# Patient Record
Sex: Male | Born: 1980 | Race: Black or African American | Hispanic: No | Marital: Single | Smoking: Current every day smoker
Health system: Southern US, Community
[De-identification: ages and names within clinical notes are randomized; demographics above are authoritative.]

## PROBLEM LIST (undated history)

## (undated) DIAGNOSIS — I10 Essential (primary) hypertension: Secondary | ICD-10-CM

## (undated) DIAGNOSIS — F1910 Other psychoactive substance abuse, uncomplicated: Secondary | ICD-10-CM

## (undated) HISTORY — DX: Other psychoactive substance abuse, uncomplicated: F19.10

## (undated) HISTORY — DX: Essential (primary) hypertension: I10

---

## 2002-06-12 ENCOUNTER — Emergency Department (HOSPITAL_COMMUNITY): Admission: EM | Admit: 2002-06-12 | Discharge: 2002-06-12 | Payer: Self-pay | Admitting: Emergency Medicine

## 2004-03-22 ENCOUNTER — Emergency Department (HOSPITAL_COMMUNITY): Admission: EM | Admit: 2004-03-22 | Discharge: 2004-03-22 | Payer: Self-pay | Admitting: Emergency Medicine

## 2004-11-10 ENCOUNTER — Emergency Department (HOSPITAL_COMMUNITY): Admission: EM | Admit: 2004-11-10 | Discharge: 2004-11-10 | Payer: Self-pay | Admitting: Emergency Medicine

## 2004-11-15 ENCOUNTER — Ambulatory Visit (HOSPITAL_COMMUNITY): Admission: RE | Admit: 2004-11-15 | Discharge: 2004-11-15 | Payer: Self-pay | Admitting: Orthopedic Surgery

## 2005-01-24 ENCOUNTER — Emergency Department (HOSPITAL_COMMUNITY): Admission: EM | Admit: 2005-01-24 | Discharge: 2005-01-24 | Payer: Self-pay | Admitting: Family Medicine

## 2007-10-08 ENCOUNTER — Emergency Department (HOSPITAL_COMMUNITY): Admission: EM | Admit: 2007-10-08 | Discharge: 2007-10-08 | Payer: Self-pay | Admitting: Emergency Medicine

## 2012-01-20 ENCOUNTER — Emergency Department (HOSPITAL_COMMUNITY)
Admission: EM | Admit: 2012-01-20 | Discharge: 2012-01-20 | Disposition: A | Discharge status: Home / Self Care | Payer: Self-pay | Attending: Emergency Medicine | Admitting: Emergency Medicine

## 2012-01-20 ENCOUNTER — Encounter (HOSPITAL_COMMUNITY): Payer: Self-pay

## 2012-01-20 ENCOUNTER — Emergency Department (INDEPENDENT_AMBULATORY_CARE_PROVIDER_SITE_OTHER)
Admission: EM | Admit: 2012-01-20 | Discharge: 2012-01-20 | Disposition: A | Discharge status: Home / Self Care | Payer: Self-pay | Source: Home / Self Care | Attending: Emergency Medicine | Admitting: Emergency Medicine

## 2012-01-20 ENCOUNTER — Encounter (HOSPITAL_COMMUNITY): Payer: Self-pay | Admitting: *Deleted

## 2012-01-20 ENCOUNTER — Emergency Department (HOSPITAL_COMMUNITY): Payer: Self-pay

## 2012-01-20 DIAGNOSIS — M25539 Pain in unspecified wrist: Secondary | ICD-10-CM | POA: Insufficient documentation

## 2012-01-20 DIAGNOSIS — S62113A Displaced fracture of triquetrum [cuneiform] bone, unspecified wrist, initial encounter for closed fracture: Secondary | ICD-10-CM | POA: Insufficient documentation

## 2012-01-20 DIAGNOSIS — Y9351 Activity, roller skating (inline) and skateboarding: Secondary | ICD-10-CM | POA: Insufficient documentation

## 2012-01-20 DIAGNOSIS — S62111A Displaced fracture of triquetrum [cuneiform] bone, right wrist, initial encounter for closed fracture: Secondary | ICD-10-CM

## 2012-01-20 DIAGNOSIS — R000 Tachycardia, unspecified: Secondary | ICD-10-CM | POA: Insufficient documentation

## 2012-01-20 MED ORDER — TRAMADOL HCL 50 MG PO TABS: 50.0000 mg | ORAL_TABLET | Freq: Four times a day (QID) | ORAL | Status: AC | PRN

## 2012-01-20 MED ORDER — IBUPROFEN 400 MG PO TABS: 800.0000 mg | ORAL_TABLET | Freq: Four times a day (QID) | ORAL | Status: AC | PRN

## 2012-01-20 MED ORDER — HYDROCODONE-ACETAMINOPHEN 5-325 MG PO TABS
ORAL_TABLET | ORAL | Status: AC
  Filled 2012-01-20: qty 2

## 2012-01-20 MED ORDER — IBUPROFEN 800 MG PO TABS: 800.0000 mg | ORAL_TABLET | Freq: Three times a day (TID) | ORAL | Status: AC

## 2012-01-20 MED ORDER — IBUPROFEN 400 MG PO TABS: 800.0000 mg | ORAL_TABLET | Freq: Four times a day (QID) | ORAL | Status: DC | PRN

## 2012-01-20 NOTE — ED Notes (Signed)
Ortho paged for R wrist splint

## 2012-01-20 NOTE — ED Provider Notes (Signed)
History     CSN: [id number]  Arrival date & time 01/20/12  0159   First MD Initiated Contact with Patient 01/20/12 0207      Chief Complaint  Patient presents with  . Wrist Pain  . Fall     HPI  Hx provided by pt.  Pt is a 31 yo M with no sig. PMH who presents with complaints of rt wrist and hand injury.  Pt reports falling back on outstretched rt hand while roller skating around 8pm earlier last evening.  Pain is an ache and throb.  Pain is moderate. Pt returned home and states he drank beer to help with his pain and relax.  He also used ice over area with slight improvement.  Pain is worse with movements. Pain has continued to bother him.  Pain is moderate to severe and throbs.  He denies any numbness or tingling of fingers. he denies any other injury. No trauma to head. No LOC.   History reviewed. No pertinent past medical history.  History reviewed. No pertinent past surgical history.  History reviewed. No pertinent family history.  History  Substance Use Topics  . Smoking status: Current Some Day Smoker  . Smokeless tobacco: Not on file  . Alcohol Use: Yes      Review of Systems  All other systems reviewed and are negative.    Allergies  Review of patient's allergies indicates no known allergies.  Home Medications  No current outpatient prescriptions on file.  BP 150/103  Pulse 130  Temp(Src) 98.4 F (36.9 C) (Oral)  Resp 16  SpO2 97%  Physical Exam  Nursing note and vitals reviewed. Constitutional: He is oriented to person, place, and time. He appears well-developed and well-nourished. No distress.  HENT:  Head: Normocephalic and atraumatic.       Breath smells of alcohol  Neck: Normal range of motion. Neck supple.       No cervical midline TTP.  Cardiovascular: Regular rhythm.  Tachycardia present.   Pulmonary/Chest: Effort normal and breath sounds normal.  Musculoskeletal: Normal range of motion. He exhibits no edema.       TTP over rt 3rd  metacarpal.  Mild Pains in rt wrist.  No deformities.  No swelling.  Normal distal sensations and cap refill in fingers.  Neurological: He is alert and oriented to person, place, and time.  Skin: Skin is warm. No erythema.  Psychiatric: He has a normal mood and affect. His behavior is normal.    ED Course  Procedures     Dg Forearm Right  01/20/2012  *RADIOLOGY REPORT*  Clinical Data: Right wrist and forearm pain status post fall.  RIGHT FOREARM - 2 VIEW  Comparison: Contemporaneous hand radiograph  Findings: No fracture or dislocation of the forearm identified. There is a curvilinear calcific density posterior to the wrist, described on contemporaneous hand radiograph.  No aggressive osseous lesion.  IMPRESSION: No acute osseous abnormality of the forearm.  Triquetral fracture, described on contemporaneous hand radiograph report.  Original Report Authenticated By: Waneta Martins, M.D.   Dg Hand Complete Right  01/20/2012  *RADIOLOGY REPORT*  Clinical Data: Right hand and forearm pain status post fall.  RIGHT HAND - COMPLETE 3+ VIEW  Comparison: Contemporaneous forearm  Findings: There is soft tissue swelling overlying the ulnar aspect of the hand.  There is a curvilinear density projecting posterior to the wrist which is favored to represent a fracture of the triquetrum. Detection for additional nondisplaced fractures is limited by  unconventional positioning.  Per the technologist, the patient was unable to flatten his hand and was not fully cooperative.  IMPRESSION: Curvilinear calcific density along the dorsal aspect of the wrist is favored to represent a triquetral fracture.  Original Report Authenticated By: Waneta Martins, M.D.     1. Fracture of triquetrum of right wrist, closed       MDM  2:10AM Pt seen and evaluated.  Pt in no acute distress.         Angus Seller, [place] 01/20/12 [phone]

## 2012-01-20 NOTE — ED Provider Notes (Addendum)
History     CSN: [id number]  Arrival date & time 01/20/12  1030   First MD Initiated Contact with Patient 01/20/12 1215      Chief Complaint  Patient presents with  . Wrist Pain    (Consider location/radiation/quality/duration/timing/severity/associated sxs/prior treatment) HPI Comments: It hurting, was not given anything for pain, tried to see the hand doctor but they asked  for 200.oo dollars  Patient is a 31 y.o. male presenting with wrist pain. The history is provided by the patient.  Wrist Pain This is a new problem. The problem has not changed since onset.Exacerbated by: movment. The symptoms are relieved by nothing.    History reviewed. No pertinent past medical history.  History reviewed. No pertinent past surgical history.  History reviewed. No pertinent family history.  History  Substance Use Topics  . Smoking status: Current Some Day Smoker  . Smokeless tobacco: Not on file  . Alcohol Use: Yes      Review of Systems  Constitutional: Negative for fever and fatigue.  Skin: Negative for rash and wound.  Neurological: Negative for weakness and numbness.    Allergies  Review of patient's allergies indicates no known allergies.  Home Medications   Current Outpatient Rx  Name Route Sig Dispense Refill  . IBUPROFEN 400 MG PO TABS Oral Take 2 tablets (800 mg total) by mouth every 6 (six) hours as needed for pain. 30 tablet 0  . IBUPROFEN 800 MG PO TABS Oral Take 1 tablet (800 mg total) by mouth 3 (three) times daily. 21 tablet 0  . TRAMADOL HCL 50 MG PO TABS Oral Take 1 tablet (50 mg total) by mouth every 6 (six) hours as needed for pain. 15 tablet 0    BP 164/71  Pulse 97  Temp(Src) 98.3 F (36.8 C) (Oral)  Resp 18  SpO2 97%  Physical Exam  Nursing note and vitals reviewed. Constitutional: He appears well-developed and well-nourished.  Musculoskeletal: He exhibits tenderness.       Right shoulder: He exhibits decreased range of motion, tenderness  and pain.       Right wrist: He exhibits decreased range of motion, tenderness and bony tenderness. He exhibits no effusion, no crepitus and no laceration.  Neurological: He is alert.  Skin: Skin is warm.    ED Course  Procedures (including critical care time)  Labs Reviewed - No data to display Dg Forearm Right  01/20/2012  *RADIOLOGY REPORT*  Clinical Data: Right wrist and forearm pain status post fall.  RIGHT FOREARM - 2 VIEW  Comparison: Contemporaneous hand radiograph  Findings: No fracture or dislocation of the forearm identified. There is a curvilinear calcific density posterior to the wrist, described on contemporaneous hand radiograph.  No aggressive osseous lesion.  IMPRESSION: No acute osseous abnormality of the forearm.  Triquetral fracture, described on contemporaneous hand radiograph report.  Original Report Authenticated By: Waneta Martins, M.D.   Dg Hand Complete Right  01/20/2012  *RADIOLOGY REPORT*  Clinical Data: Right hand and forearm pain status post fall.  RIGHT HAND - COMPLETE 3+ VIEW  Comparison: Contemporaneous forearm  Findings: There is soft tissue swelling overlying the ulnar aspect of the hand.  There is a curvilinear density projecting posterior to the wrist which is favored to represent a fracture of the triquetrum. Detection for additional nondisplaced fractures is limited by unconventional positioning.  Per the technologist, the patient was unable to flatten his hand and was not fully cooperative.  IMPRESSION: Curvilinear calcific density along the  dorsal aspect of the wrist is favored to represent a triquetral fracture.  Original Report Authenticated By: Waneta Martins, M.D.     1. Triquetral fracture       MDM  No neurovascular deficits, here today for pain control.        Jimmie Molly, MD 01/20/12 1610  Jimmie Molly, MD 04/08/12 [phone]

## 2012-01-20 NOTE — ED Notes (Signed)
Pt called x1

## 2012-01-20 NOTE — ED Notes (Signed)
Fell while skating with his kids, put R arm out to brace fall, c/o R wrist pain, pain into hand at up to elbow, CMS intact, ROM limited d/t pain, guarding movements, no obvious deformity, no meds PTA, Td unknown, skin intact. Pinpoints pain to distal and mid ulna.

## 2012-01-20 NOTE — ED Notes (Signed)
Seen in ED this am for rt wrist injury/fracture.  States he needs something for the pain, states called orthopedist but he doesn't have the money for the visit.

## 2012-01-21 NOTE — ED Provider Notes (Signed)
31 year old male who presents after a fall while rollerskating onto his right hand and wrist. This occurred several hours ago, the pain is constant, moderate, worse with movement and not associated with numbness or tingling of the fingers. He denies any other injuries   Physical exam: Normocephalic atraumatic, conjunctiva clear no scleral icterus, speech is clear, gait is normal, motor is intact, sensation intact to the extremities, tenderness over the dorsum of the right wrist and in the anatomic snuff box. There is no tenderness over the distal radius and ulna. Normal capillary refill   Assessment: Imaging pending, according to my interpretation of the x-rays there appears to be a small fracture off the dorsum of the wrist, no obvious fracture of the scaphoid, and immobilize, anti-inflammatory therapy and f/u  Medical screening examination/treatment/procedure(s) were conducted as a shared visit with non-physician practitioner(s) and myself. I personally evaluated the patient during the encounter   Vida Roller, MD 01/21/12 [phone]

## 2012-01-27 ENCOUNTER — Ambulatory Visit (INDEPENDENT_AMBULATORY_CARE_PROVIDER_SITE_OTHER): Payer: Self-pay | Admitting: Family Medicine

## 2012-01-27 ENCOUNTER — Encounter: Payer: Self-pay | Admitting: Family Medicine

## 2012-01-27 VITALS — BP 163/100 | HR 65 | Ht 68.0 in | Wt 170.0 lb

## 2012-01-27 DIAGNOSIS — S62111A Displaced fracture of triquetrum [cuneiform] bone, right wrist, initial encounter for closed fracture: Secondary | ICD-10-CM

## 2012-01-27 DIAGNOSIS — S62113A Displaced fracture of triquetrum [cuneiform] bone, unspecified wrist, initial encounter for closed fracture: Secondary | ICD-10-CM

## 2012-01-27 MED ORDER — IBUPROFEN 800 MG PO TABS: 800.0000 mg | ORAL_TABLET | Freq: Three times a day (TID) | ORAL | Status: AC | PRN

## 2012-01-27 MED ORDER — TRAMADOL HCL 50 MG PO TABS: 50.0000 mg | ORAL_TABLET | Freq: Four times a day (QID) | ORAL | Status: AC | PRN

## 2012-01-27 NOTE — Progress Notes (Signed)
  Subjective:    Patient ID: Daniel Shepard, male    DOB: [birth year], 31 y.o.   MRN: [id number]  HPI Mr. Sime fell from his feel on 01/19/12 and injured his right wrist when he landed, he was seen at [facility] , x rays were done, he was diagnosed with a triquetrum fracture, he was immobilized with a wrist splint and given medication for pain control. He states that his wrist is hurting, the pain is diffuse in the ulnar side of the wrist and radiated to his fifth finger, sharp, 4/10 intensity. He has been using his splint with high compliance.  There is no problem list on file for this patient.  Current Outpatient Prescriptions on File Prior to Visit  Medication Sig Dispense Refill  . ibuprofen (ADVIL,MOTRIN) 400 MG tablet Take 2 tablets (800 mg total) by mouth every 6 (six) hours as needed for pain.  30 tablet  0  . traMADol (ULTRAM) 50 MG tablet Take 1 tablet (50 mg total) by mouth every 6 (six) hours as needed for pain.  15 tablet  0   No Known Allergies    Review of Systems  Constitutional: Negative for chills, diaphoresis and fatigue.  Musculoskeletal: Negative for myalgias, back pain, joint swelling, arthralgias and gait problem.  Neurological: Negative for dizziness, tremors, weakness and numbness.       Objective:   Physical Exam  Constitutional: He is oriented to person, place, and time. He appears well-developed and well-nourished.       BP 163/100  Pulse 65  Ht 5\' 8"  (1.727 m)  Wt 170 lb (77.111 kg)  BMI 25.85 kg/m2   Pulmonary/Chest: Effort normal.  Musculoskeletal:       Right wrist with intact skin, no swelling, no hematoma. TTP in the ulnar aspect of his wrist, mild TTP above the fifth metacarpal, no swelling or crepitus. Neurovascularly intact.  Neurological: He is alert and oriented to person, place, and time.  Skin: Skin is warm.  Psychiatric: He has a normal mood and affect. His behavior is normal.    X arys from ER review showing triquetrum fracture      Assessment & Plan:   1. Fracture of triquetrum of right wrist, closed  ibuprofen (ADVIL,MOTRIN) 800 MG tablet, traMADol (ULTRAM) 50 MG tablet   Discussed with him the merit of a cast, he declined Recommended to continue using the wrist splint Rx given for motrin 800mg  and tramadol F/U in 1 week. We will repeat x rays. Recommended to establish care with PCP for HTN

## 2012-01-27 NOTE — Patient Instructions (Signed)
[facility] [phone]  [facility] -[phone]  These offices are accepting new patients

## 2012-01-30 ENCOUNTER — Ambulatory Visit (INDEPENDENT_AMBULATORY_CARE_PROVIDER_SITE_OTHER): Payer: Self-pay | Admitting: Sports Medicine

## 2012-01-30 ENCOUNTER — Encounter: Payer: Self-pay | Admitting: Sports Medicine

## 2012-01-30 VITALS — BP 146/102 | HR 65 | Temp 98.3°F | Ht 68.0 in | Wt 167.7 lb

## 2012-01-30 DIAGNOSIS — Z720 Tobacco use: Secondary | ICD-10-CM

## 2012-01-30 DIAGNOSIS — Z23 Encounter for immunization: Secondary | ICD-10-CM

## 2012-01-30 DIAGNOSIS — F1010 Alcohol abuse, uncomplicated: Secondary | ICD-10-CM

## 2012-01-30 DIAGNOSIS — F17200 Nicotine dependence, unspecified, uncomplicated: Secondary | ICD-10-CM

## 2012-01-30 DIAGNOSIS — I10 Essential (primary) hypertension: Secondary | ICD-10-CM | POA: Insufficient documentation

## 2012-01-30 LAB — BASIC METABOLIC PANEL
BUN: 13 mg/dL (ref 6–23)
CO2: 25 mEq/L (ref 19–32)
Calcium: 10.2 mg/dL (ref 8.4–10.5)
Chloride: 99 mEq/L (ref 96–112)
Creat: 0.95 mg/dL (ref 0.50–1.35)
Glucose, Bld: 82 mg/dL (ref 70–99)
Potassium: 5.1 mEq/L (ref 3.5–5.3)
Sodium: 139 mEq/L (ref 135–145)

## 2012-01-30 MED ORDER — HYDROCHLOROTHIAZIDE 25 MG PO TABS: 25.0000 mg | ORAL_TABLET | Freq: Every day | ORAL | Status: DC

## 2012-01-30 NOTE — Progress Notes (Signed)
Patient ID: Daniel Shepard, male   DOB: 14-Jan-1981, 31 y.o.   MRN: [id number] Subjective:  Daniel Shepard is a 31 y.o. male presenting today for establishing care and evaluation of his elevated BPs.  He reports that he is otherwise healthy and no prior hospitalizations.  Does report elevated BPs in past but has never had then treated.  He was incarcerated for a DUI in 2007 and he was noted at that time to have elevated BP but had not been followed since that time.  No prior medication use.   ROS  Constitutional No changes in behaviors  Infectious No recent illnesses  Resp No reported cough or congestion  Cardiac No reported chest pain, or prior cardiac illnesses  GI Occasional abdominal pain in area of umbilical hernia that is self reducible  Psych Hx of EtOH abuse - prior incarceration for DUI. No other reported psychiatric illness  MSK Recent Triquetal fx of R wrist reported from slipping and Falling.  Prior R shoulder fx  Diet Lots of home cooked - mainly fried foods; avoids excess additional salt but does not prepare his own food  MEDS NSAID and tramadol only for wrist fx    Past Medical History  Diagnosis Date  . Substance abuse   . Hypertension     Noted in 2007 but untreated   History   Social History  . Marital Status: Single    Spouse Name: N/A    Number of Children: N/A  . Years of Education: N/A   Social History Main Topics  . Smoking status: Current Everyday Smoker -- 0.3 packs/day    Types: Cigarettes  . Smokeless tobacco: Never Used  . Alcohol Use: Yes     1-3 40oz beers per episode - average of 7 X 40oz per week  . Drug Use: Yes    Special: Marijuana     Denies prior cocaine, heroin or IV Use  . Sexually Active: None   Other Topics Concern  . 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  Healthy Mother   . Healthy Father   . Healthy Maternal Grandfather   . Healthy Brother   . Healthy Sister    Medications Reviewed: yes  PE: GENERAL:  Adult AA male, examined in [facility].  Alert and appropriate, in no discomfort, no resp distress HNEENT: AT/[place], MMM, no scleral icterus, EOMi THORAX: HEART: RRR, S1/S2 heard, no murmur LUNGS: CTA B, no wheezes, mild bibasilar crackles clears with cough ABDOMEN:  +BS, soft, non-tender, no rigidity, no guarding, no organomegaly, umbilical hernia easily reducible, pain free EXTREMITIES: R UE in soft splint >PSYCH: Affect appropriate, admits ot EtOH abuse in past; contemplative stage for smoking cessation; no evidence of SI/HI

## 2012-01-30 NOTE — Patient Instructions (Addendum)
It was nice to see you today.  I would like to start you on a blood pressure medication called Hydrochlorothiazide (HCTZ). We will check some blood work today and would like to see you back in 3 to 4 weeks to check and see how your BP is doing as well as recheck your blood work.    Continue to try to cut down on your cigarette smoking and limit your alcohol intake to a maximum of 1 40oz per day.  We would be glad to discuss this further with you in the future.    Please plan to return to see me in 3 to 4 weeks.  If you need anything prior to seeing me please call the clinic.  Arterial Hypertension Arterial hypertension (high blood pressure) is a condition of elevated pressure in your blood vessels. Hypertension over a long period of time is a risk factor for strokes, heart attacks, and heart failure. It is also the leading cause of kidney (renal) failure.   CAUSES    In Adults -- Over 90% of all hypertension has no known cause. This is called essential or primary hypertension. In the other 10% of people with hypertension, the increase in blood pressure is caused by another disorder. This is called secondary hypertension. Important causes of secondary hypertension are:     Heavy alcohol use.     Obstructive sleep apnea.     Hyperaldosterosim (Conn's syndrome).     Steroid use.     Chronic kidney failure.     Hyperparathyroidism.    Medications.    Renal artery stenosis.     Pheochromocytoma.    Cushing's disease.     Coarctation of the aorta.     Scleroderma renal crisis.     Licorice (in excessive amounts).     Drugs (cocaine, methamphetamine).  Your caregiver can explain any items above that apply to you.  In Children -- Secondary hypertension is more common and should always be considered.     Pregnancy -- Few women of childbearing age have high blood pressure. However, up to 10% of them develop hypertension of pregnancy. Generally, this will not harm the woman. It may be  a sign of 3 complications of pregnancy: preeclampsia, HELLP syndrome, and eclampsia. Follow up and control with medication is necessary.  SYMPTOMS    This condition normally does not produce any noticeable symptoms. It is usually found during a routine exam.     Malignant hypertension is a late problem of high blood pressure. It may have the following symptoms:     Headaches.    Blurred vision.     End-organ damage (this means your kidneys, heart, lungs, and other organs are being damaged).     Stressful situations can increase the blood pressure. If a person with normal blood pressure has their blood pressure go up while being seen by their caregiver, this is often termed "white coat hypertension." Its importance is not known. It may be related with eventually developing hypertension or complications of hypertension.     Hypertension is often confused with mental tension, stress, and anxiety.  DIAGNOSIS   The diagnosis is made by 3 separate blood pressure measurements. They are taken at least 1 week apart from each other. If there is organ damage from hypertension, the diagnosis may be made without repeat measurements. Hypertension is usually identified by having blood pressure readings:  Above 140/90 mmHg measured in both arms, at 3 separate times, over a couple weeks.  Over 130/80 mmHg should be considered a risk factor and may require treatment in patients with diabetes.  Blood pressure readings over 120/80 mmHg are called "pre-hypertension" even in non-diabetic patients. To get a true blood pressure measurement, use the following guidelines. Be aware of the factors that can alter blood pressure readings.  Take measurements at least 1 hour after caffeine.     Take measurements 30 minutes after smoking and without any stress. This is another reason to quit smoking - it raises your blood pressure.     Use a proper cuff size. Ask your caregiver if you are not sure about your cuff  size.     Most home blood pressure cuffs are automatic. They will measure systolic and diastolic pressures. The systolic pressure is the pressure reading at the start of sounds. Diastolic pressure is the pressure at which the sounds disappear. If you are elderly, measure pressures in multiple postures. Try sitting, lying or standing.     Sit at rest for a minimum of 5 minutes before taking measurements.     You should not be on any medications like decongestants. These are found in many cold medications.     Record your blood pressure readings and review them with your caregiver.  If you have hypertension:  Your caregiver may do tests to be sure you do not have secondary hypertension (see "causes" above).     Your caregiver may also look for signs of metabolic syndrome. This is also called Syndrome X or Insulin Resistance Syndrome. You may have this syndrome if you have type 2 diabetes, abdominal obesity, and abnormal blood lipids in addition to hypertension.     Your caregiver will take your medical and family history and perform a physical exam.     Diagnostic tests may include blood tests (for glucose, cholesterol, potassium, and kidney function), a urinalysis, or an EKG. Other tests may also be necessary depending on your condition.  PREVENTION   There are important lifestyle issues that you can adopt to reduce your chance of developing hypertension:  Maintain a normal weight.     Limit the amount of salt (sodium) in your diet.     Exercise often.     Limit alcohol intake.     Get enough potassium in your diet. Discuss specific advice with your caregiver.     Follow a DASH diet (dietary approaches to stop hypertension). This diet is rich in fruits, vegetables, and low-fat dairy products, and avoids certain fats.  PROGNOSIS   Essential hypertension cannot be cured. Lifestyle changes and medical treatment can lower blood pressure and reduce complications. The prognosis of secondary  hypertension depends on the underlying cause. Many people whose hypertension is controlled with medicine or lifestyle changes can live a normal, healthy life.   RISKS AND COMPLICATIONS   While high blood pressure alone is not an illness, it often requires treatment due to its short- and long-term effects on many organs. Hypertension increases your risk for:  CVAs or strokes (cerebrovascular accident).     Heart failure due to chronically high blood pressure (hypertensive cardiomyopathy).     Heart attack (myocardial infarction).     Damage to the retina (hypertensive retinopathy).     Kidney failure (hypertensive nephropathy).  Your caregiver can explain list items above that apply to you. Treatment of hypertension can significantly reduce the risk of complications. TREATMENT    For overweight patients, weight loss and regular exercise are recommended. Physical fitness lowers blood pressure.  Mild hypertension is usually treated with diet and exercise. A diet rich in fruits and vegetables, fat-free dairy products, and foods low in fat and salt (sodium) can help lower blood pressure. Decreasing salt intake decreases blood pressure in a 1/3 of people.     Stop smoking if you are a smoker.  The steps above are highly effective in reducing blood pressure. While these actions are easy to suggest, they are difficult to achieve. Most patients with moderate or severe hypertension end up requiring medications to bring their blood pressure down to a normal level. There are several classes of medications for treatment. Blood pressure pills (antihypertensives) will lower blood pressure by their different actions. Lowering the blood pressure by 10 mmHg may decrease the risk of complications by as much as 25%. The goal of treatment is effective blood pressure control. This will reduce your risk for complications. Your caregiver will help you determine the best treatment for you according to your  lifestyle. What is excellent treatment for one person, may not be for you. HOME CARE INSTRUCTIONS    Do not smoke.     Follow the lifestyle changes outlined in the "Prevention" section.     If you are on medications, follow the directions carefully. Blood pressure medications must be taken as prescribed. Skipping doses reduces their benefit. It also puts you at risk for problems.     Follow up with your caregiver, as directed.     If you are asked to monitor your blood pressure at home, follow the guidelines in the "Diagnosis" section above.  SEEK MEDICAL CARE IF:    You think you are having medication side effects.     You have recurrent headaches or lightheadedness.     You have swelling in your ankles.     You have trouble with your vision.  SEEK IMMEDIATE MEDICAL CARE IF:    You have sudden onset of chest pain or pressure, difficulty breathing, or other symptoms of a heart attack.     You have a severe headache.     You have symptoms of a stroke (such as sudden weakness, difficulty speaking, difficulty walking).  MAKE SURE YOU:    Understand these instructions.     Will watch your condition.     Will get help right away if you are not doing well or get worse.  Document Released: 11/28/2005 Document Revised: 08/10/2011 Document Reviewed: 06/28/2007 [facility] Patient Information 2012 [place], [place].

## 2012-02-02 ENCOUNTER — Telehealth: Payer: Self-pay | Admitting: *Deleted

## 2012-02-02 ENCOUNTER — Ambulatory Visit (HOSPITAL_COMMUNITY)
Admission: RE | Admit: 2012-02-02 | Discharge: 2012-02-02 | Disposition: A | Discharge status: Home / Self Care | Payer: Self-pay | Source: Ambulatory Visit | Attending: Family Medicine | Admitting: Family Medicine

## 2012-02-02 ENCOUNTER — Ambulatory Visit: Payer: Self-pay | Admitting: Family Medicine

## 2012-02-02 DIAGNOSIS — S62111A Displaced fracture of triquetrum [cuneiform] bone, right wrist, initial encounter for closed fracture: Secondary | ICD-10-CM

## 2012-02-02 DIAGNOSIS — IMO0001 Reserved for inherently not codable concepts without codable children: Secondary | ICD-10-CM | POA: Insufficient documentation

## 2012-02-02 NOTE — Telephone Encounter (Signed)
Opened for x-ray order entry.

## 2012-02-03 DIAGNOSIS — Z720 Tobacco use: Secondary | ICD-10-CM | POA: Insufficient documentation

## 2012-02-03 DIAGNOSIS — F1010 Alcohol abuse, uncomplicated: Secondary | ICD-10-CM | POA: Insufficient documentation

## 2012-02-03 NOTE — Assessment & Plan Note (Signed)
Discussed quit options Pt in contemplative stage

## 2012-02-03 NOTE — Assessment & Plan Note (Addendum)
Will start HCTZ today.   Recheck bmet at next visit Normal BMET today

## 2012-02-03 NOTE — Assessment & Plan Note (Signed)
Pt encouraged to decrease bing behaviors Readdress CAGE ?s at next visit

## 2012-02-16 ENCOUNTER — Ambulatory Visit: Payer: Self-pay | Admitting: Family Medicine

## 2012-02-23 ENCOUNTER — Ambulatory Visit (INDEPENDENT_AMBULATORY_CARE_PROVIDER_SITE_OTHER): Payer: Self-pay | Admitting: Family Medicine

## 2012-02-23 ENCOUNTER — Encounter: Payer: Self-pay | Admitting: Family Medicine

## 2012-02-23 ENCOUNTER — Ambulatory Visit (HOSPITAL_COMMUNITY)
Admission: RE | Admit: 2012-02-23 | Discharge: 2012-02-23 | Disposition: A | Discharge status: Home / Self Care | Payer: Self-pay | Source: Ambulatory Visit | Attending: Family Medicine | Admitting: Family Medicine

## 2012-02-23 VITALS — BP 163/101 | HR 81

## 2012-02-23 DIAGNOSIS — S62113A Displaced fracture of triquetrum [cuneiform] bone, unspecified wrist, initial encounter for closed fracture: Secondary | ICD-10-CM | POA: Insufficient documentation

## 2012-02-23 DIAGNOSIS — F17200 Nicotine dependence, unspecified, uncomplicated: Secondary | ICD-10-CM | POA: Insufficient documentation

## 2012-02-23 DIAGNOSIS — X58XXXA Exposure to other specified factors, initial encounter: Secondary | ICD-10-CM | POA: Insufficient documentation

## 2012-02-23 DIAGNOSIS — I10 Essential (primary) hypertension: Secondary | ICD-10-CM | POA: Insufficient documentation

## 2012-02-23 DIAGNOSIS — F1010 Alcohol abuse, uncomplicated: Secondary | ICD-10-CM | POA: Insufficient documentation

## 2012-02-23 DIAGNOSIS — S62111A Displaced fracture of triquetrum [cuneiform] bone, right wrist, initial encounter for closed fracture: Secondary | ICD-10-CM | POA: Insufficient documentation

## 2012-02-23 NOTE — Patient Instructions (Signed)
X ray Wright wrist Continue using the wrist brace for the next 2 Weeks and then wean off it. F/U in 4 weeks.

## 2012-02-23 NOTE — Progress Notes (Signed)
  Subjective:    Patient ID: Daniel Shepard, male    DOB: [birth year], 31 y.o.   MRN: [id number]  HPI  Daniel Shepard is a pleasant to 31 years old male patient of my weeks status post right wrist injury with a fracture of the right triquetrum. He has been immobilized with a wrist brace. He had x-rays 2 weeks ago showing a stable fractures. No displacement. No new fractures. The scaphoid is intact. He states that he is much better. He is back to work using the wrist brace. He has stopped using the wrist brace at home and working on his right wrist range of motion. He complains of mild pain in the dorsum of his right hand on the cubital side at the wrist level. Is a mild pain. 2/10 intensity. No numbness no tingling. No crepitus. No swelling.  Patient Active Problem List  Diagnoses  . Hypertension  . Tobacco abuse  . ETOH abuse  . Fracture of triquetrum of right wrist, closed   Current Outpatient Prescriptions on File Prior to Visit  Medication Sig Dispense Refill  . hydrochlorothiazide (HYDRODIURIL) 25 MG tablet Take 1 tablet (25 mg total) by mouth daily.  30 tablet  11  . traMADol (ULTRAM) 50 MG tablet Take 1 tablet (50 mg total) by mouth every 6 (six) hours as needed for pain.  40 tablet  0   No Known Allergies    Review of Systems  Constitutional: Negative for fever, chills, diaphoresis and fatigue.  Musculoskeletal: Negative for back pain, joint swelling, arthralgias and gait problem.  Neurological: Negative for tremors, weakness and numbness.       Objective:   Physical Exam  Constitutional: He is oriented to person, place, and time. He appears well-developed and well-nourished.       Blood pressure 163/101, pulse 81.   Pulmonary/Chest: Effort normal.  Musculoskeletal:       Right wrist with intact skin tear no swelling although there range of motion flexion extension radial and ulnar deviation.Marland Kitchen there is mild TTP in bed ulnar dorsum of the right wrist.. no crepitus. No  swelling. Neurovascularly intact   Neurological: He is alert and oriented to person, place, and time.  Skin: Skin is warm. No rash noted. No erythema.  Psychiatric: He has a normal mood and affect. His behavior is normal. Thought content normal.          Assessment & Plan:   1. Fracture of triquetrum of right wrist, closed  DG Hand Complete Right   X ray Wright wrist Continue using the wrist brace for the next 2 Weeks and then wean off it. I will call him back with x ray result if needed F/U in 4 weeks.

## 2015-03-30 ENCOUNTER — Encounter (HOSPITAL_COMMUNITY): Payer: Self-pay | Admitting: Emergency Medicine

## 2015-03-30 ENCOUNTER — Emergency Department (INDEPENDENT_AMBULATORY_CARE_PROVIDER_SITE_OTHER)
Admission: EM | Admit: 2015-03-30 | Discharge: 2015-03-30 | Disposition: A | Discharge status: Home / Self Care | Payer: Self-pay | Source: Home / Self Care | Attending: Family Medicine | Admitting: Family Medicine

## 2015-03-30 ENCOUNTER — Emergency Department (HOSPITAL_COMMUNITY)
Admission: EM | Admit: 2015-03-30 | Discharge: 2015-03-30 | Disposition: A | Discharge status: Home / Self Care | Payer: Self-pay | Attending: Emergency Medicine | Admitting: Emergency Medicine

## 2015-03-30 DIAGNOSIS — Z79899 Other long term (current) drug therapy: Secondary | ICD-10-CM | POA: Insufficient documentation

## 2015-03-30 DIAGNOSIS — Z720 Tobacco use: Secondary | ICD-10-CM | POA: Insufficient documentation

## 2015-03-30 DIAGNOSIS — T63441D Toxic effect of venom of bees, accidental (unintentional), subsequent encounter: Secondary | ICD-10-CM

## 2015-03-30 DIAGNOSIS — T63441A Toxic effect of venom of bees, accidental (unintentional), initial encounter: Secondary | ICD-10-CM

## 2015-03-30 DIAGNOSIS — S40862D Insect bite (nonvenomous) of left upper arm, subsequent encounter: Secondary | ICD-10-CM | POA: Insufficient documentation

## 2015-03-30 DIAGNOSIS — W57XXXD Bitten or stung by nonvenomous insect and other nonvenomous arthropods, subsequent encounter: Secondary | ICD-10-CM | POA: Insufficient documentation

## 2015-03-30 DIAGNOSIS — I10 Essential (primary) hypertension: Secondary | ICD-10-CM | POA: Insufficient documentation

## 2015-03-30 MED ORDER — PREDNISONE 20 MG PO TABS
60.0000 mg | ORAL_TABLET | Freq: Once | ORAL | Status: AC
  Administered 2015-03-30: 60 mg via ORAL

## 2015-03-30 MED ORDER — HYDROXYZINE HCL 25 MG PO TABS: 25.0000 mg | ORAL_TABLET | Freq: Three times a day (TID) | ORAL | Status: DC | PRN

## 2015-03-30 MED ORDER — EPINEPHRINE 0.3 MG/0.3ML IJ SOAJ: 0.3000 mg | Freq: Once | INTRAMUSCULAR | Status: AC

## 2015-03-30 MED ORDER — FAMOTIDINE 20 MG PO TABS: 20.0000 mg | ORAL_TABLET | Freq: Two times a day (BID) | ORAL | Status: DC

## 2015-03-30 MED ORDER — DIPHENHYDRAMINE HCL 25 MG PO CAPS
25.0000 mg | ORAL_CAPSULE | Freq: Once | ORAL | Status: AC
  Administered 2015-03-30: 25 mg via ORAL

## 2015-03-30 MED ORDER — DIPHENHYDRAMINE HCL 25 MG PO CAPS
ORAL_CAPSULE | ORAL | Status: AC
  Filled 2015-03-30: qty 1

## 2015-03-30 MED ORDER — FAMOTIDINE 20 MG PO TABS
20.0000 mg | ORAL_TABLET | Freq: Once | ORAL | Status: AC
  Administered 2015-03-30: 20 mg via ORAL

## 2015-03-30 MED ORDER — FAMOTIDINE 20 MG PO TABS
ORAL_TABLET | ORAL | Status: AC
  Filled 2015-03-30: qty 1

## 2015-03-30 MED ORDER — PREDNISONE 20 MG PO TABS
ORAL_TABLET | ORAL | Status: AC
  Filled 2015-03-30: qty 3

## 2015-03-30 MED ORDER — PREDNISONE 10 MG PO TABS: ORAL_TABLET | ORAL | Status: DC

## 2015-03-30 NOTE — Progress Notes (Signed)
34 yr old male self pay [place] pt with bee sting on 03/29/15.   Pt prescribed Epipen.  Pt unable to afford CM consulted by ED PA/NP, Rob and EDP, Jeraldine LootsLockwood.   Reviewed EPIC noted pt without insurance coverage.  Cm consulted with CM team members, [facility] and [facility], Arlys JohnBrian.  [facility] pharmacist, Milderd MeagerOriana states [facility] do not have epipens at this time.   Arlys JohnBrian in outpatient pharmacy offered mylan epipen pen pt assistance program.   This offered to the program  Mylan EpiPen 2-Pak Auto-Injector Patient Assistance Program  EpiPen (epinephrine)    CONTACT INFO    Address: [address]  [place], [place] [id number]   Phone: [phone] Provider Phone:   Fax: [phone] Website:     ELIGIBILITY     Eligibility Info:  Patient must be uninsured and meet program income guidelines which are not disclosed. Patients with Medicare Part D are not eligible.  Income at or below: Single  200 % FPL   Couple  200 % FPL  Federal Poverty Level Calculator     Medical expenses can be deducted from reported income: Not Published  Social security requested on form: Yes  [place] citizenship/residency specified:  Yes    APPLICATION    Attachments Required: Surveyor, mineralsinancial     Physician License # Required: Both DEA and Advertising account plannertate     Prescriber Signature Allowed: Physician     Application may be faxed: Yes     Eligibility determination letter sent: Both Provider and Patient       MEDICATION    Receives: Medication     Shipped To: Provider     Quantity in Shipment:     Delivery Time: Not Published     Re-application Policy: New application every 12 months New financial information every 12 months     Refill Policy: Once approved, patient is eligible replacement medication for up to one year. A completed Replenishment Authorization Form must be faxed and approved.     Other Information:     Last Updated: 03/04/2015  Pt shown how to retrieve a new application if needed He voiced  understanding   CM spoke with pt who [place]cp. CM discussed and provided written information for self pay pcps, importance of pcp for f/u care, www.needymeds.org, www.goodrx.com, discounted pharmacies and other [place] resources such as [facility], [facility], affordable care act,  financial assistance, DSS and  health department  Reviewed resources for [place] self pay pcps like [facility], [facility] medicine at [address], [facility], general medical clinics, [facility] plus others, medication resources, [facility] out patient pharmacies and housing Pt voiced understanding and appreciation of resources provided   Provided [facility] contact information Pt agreed to referral Cm completed referral Pt states he and his mother started [facility] process x 1 but does not know the result of process Pt willing to try for himself Male at bedside inquired if pt could go [facility] Cm informed her he can see providers if need Also informed pt he could go to urgent care also and use a pcp services if needed Discussed [phone] to see pts as first come, first serve Cm encouraged pt if this is his choice, to take all financial information including his application for epipen Pt voiced understanding

## 2015-03-30 NOTE — ED Notes (Signed)
Pt verbalizes understanding of when to use epipen.  Instructions on obtaining orange card given by Selena BattenKim CM

## 2015-03-30 NOTE — ED Notes (Signed)
Pt states that he was stung by a bee yesterday on the right arm and this morning he woke up with his right forearm swollen

## 2015-03-30 NOTE — Discharge Instructions (Signed)

## 2015-03-30 NOTE — ED Provider Notes (Signed)
CSN: [id number]     Arrival date & time 03/30/15  1445 History   First MD [id number] Contact with Patient 03/30/15 1453     Chief Complaint  Patient presents with  . Insect Bite    pt reports stung by bee to L posterior FA yesterday, seen at UC this AM and sts getting worse so came to ER     (Consider location/radiation/quality/duration/timing/severity/associated sxs/prior Treatment) HPI Comments: Patient presents to the emergency department for reevaluation of bee sting. Patient was stung by a bee yesterday. Reports having some swelling of his left arm near the bite/sting. Patient was seen by urgent care this morning and prescribed prednisone, Benadryl, Atarax, and an EpiPen. Patient was given all these medications with the exception of EpiPen at urgent care. Patient denies any shortness of breath, wheezing, nausea, vomiting, or diarrhea. Denies any history of anaphylaxis.  States that the swelling worsened after leaving urgent care, but has not worsened since.  The history is provided by the patient. No language interpreter was used.    Past Medical History  Diagnosis Date  . Substance abuse   . Hypertension     Noted in 2007 but untreated   History reviewed. No pertinent past surgical history. Family History  Problem Relation Age of Onset  . Healthy Mother   . Healthy Father   . Healthy Maternal Grandfather   . Healthy Brother   . Healthy Sister    History  Substance Use Topics  . Smoking status: Current Every Day Smoker -- 0.50 packs/day    Types: Cigarettes  . Smokeless tobacco: Never Used  . Alcohol Use: Yes     Comment: pt reports 6 beers daily    Review of Systems  Constitutional: Negative for fever and chills.  Respiratory: Negative for shortness of breath.   Cardiovascular: Negative for chest pain.  Gastrointestinal: Negative for nausea, vomiting, diarrhea and constipation.  Genitourinary: Negative for dysuria.  Skin: Positive for rash.  All other systems  reviewed and are negative.     Allergies  Review of patient's allergies indicates no known allergies.  Home Medications   Prior to Admission medications   Medication Sig Start Date End Date Taking? Authorizing Provider  EPINEPHrine (EPIPEN 2-PAK) 0.3 mg/0.3 mL IJ SOAJ injection Inject 0.3 mLs (0.3 mg total) into the muscle once. 03/30/15   Mathis FareJennifer Lee H Presson, PA  famotidine (PEPCID) 20 MG tablet Take 1 tablet (20 mg total) by mouth 2 (two) times daily. X 5 days 03/30/15   Mathis FareJennifer Lee H Presson, PA  hydrochlorothiazide (HYDRODIURIL) 25 MG tablet Take 1 tablet (25 mg total) by mouth daily. 01/30/12 01/29/13  Andrena MewsMichael D Rigby, DO  hydrOXYzine (ATARAX/VISTARIL) 25 MG tablet Take 1 tablet (25 mg total) by mouth 3 (three) times daily as needed for itching. 03/30/15   Ria ClockJennifer Lee H Presson, PA  predniSONE (DELTASONE) 10 MG tablet Beginning 03/31/2015, take 4 tabs po QD day 1, 3 tabs po QD day 2, 2 tabs po QD day 3 and 1 tab po QD day 4 then stop 03/30/15   Jess BartersJennifer Lee H Presson, PA   BP 154/93 mmHg  Pulse 83  Temp(Src) 98.3 F (36.8 C) (Oral)  Resp 16  Ht 5\' 8"  (1.727 m)  Wt 176 lb (79.833 kg)  BMI 26.77 kg/m2  SpO2 99% Physical Exam  Constitutional: He is oriented to person, place, and time. He appears well-developed and well-nourished.  HENT:  Head: Normocephalic and atraumatic.  Oropharynx is clear  Eyes: Conjunctivae  and EOM are normal. Pupils are equal, round, and reactive to light. Right eye exhibits no discharge. Left eye exhibits no discharge. No scleral icterus.  Neck: Normal range of motion. Neck supple. No JVD present.  Cardiovascular: Normal rate, regular rhythm, normal heart sounds and intact distal pulses.  Exam reveals no gallop and no friction rub.   No murmur heard. Pulmonary/Chest: Effort normal and breath sounds normal. No respiratory distress. He has no wheezes. He has no rales. He exhibits no tenderness.  Clear to auscultation bilaterally  Abdominal: Soft. He  exhibits no distension and no mass. There is no tenderness. There is no rebound and no guarding.  Musculoskeletal: Normal range of motion. He exhibits no edema or tenderness.  Moves all extremities  Neurological: He is alert and oriented to person, place, and time.  Sensation intact  Skin: Skin is warm and dry.  Mild swelling of left forearm, no hand involvement  Psychiatric: He has a normal mood and affect. His behavior is normal. Judgment and thought content normal.  Nursing note and vitals reviewed.   ED Course  Procedures (including critical care time) Labs Review Labs Reviewed - No data to display  Imaging Review No results found.   EKG Interpretation None      MDM   Final diagnoses:  Bee sting, accidental or unintentional, subsequent encounter    Patient seen for bee sting. He has prescription for prednisone, Benadryl, Atarax, and an EpiPen. Appears well in the ED today. No worsening of symptoms since being in the emergency department. Return precautions given. Patient given resources to obtain free EpiPen from the drug company.    Roxy Horseman, PA-C 03/30/15 1619  Pricilla Loveless, MD 04/04/15 [phone]

## 2015-03-30 NOTE — ED Provider Notes (Signed)
CSN: [id number]     Arrival date & time 03/30/15  9758 History   First MD Initiated Contact with Patient 03/30/15 0940     Chief Complaint  Patient presents with  . Insect Bite    bee sting   (Consider location/radiation/quality/duration/timing/severity/associated sxs/prior Treatment) HPI Comments: Was stung by bee at left forearm yesterday. States he developed diffuse hives almost immediately after sting along with localized swelling and without swelling of face, lips, or tongue. No difficulty breathing, speaking or swallowing. At time of sting, took oral dose of benadryl at home and states hives improved. States left forearm remains red, itchy and swollen.  Works as Dealer PCP: none  The history is provided by the patient.    Past Medical History  Diagnosis Date  . Substance abuse   . Hypertension     Noted in 2007 but untreated   History reviewed. No pertinent past surgical history. Family History  Problem Relation Age of Onset  . Healthy Mother   . Healthy Father   . Healthy Maternal Grandfather   . Healthy Brother   . Healthy Sister    History  Substance Use Topics  . Smoking status: Current Every Day Smoker -- 0.50 packs/day    Types: Cigarettes  . Smokeless tobacco: Never Used  . Alcohol Use: Yes     Comment: pt reports 6 beers daily    Review of Systems  All other systems reviewed and are negative.   Allergies  Review of patient's allergies indicates no known allergies.  Home Medications   Prior to Admission medications   Medication Sig Start Date End Date Taking? Authorizing Provider  EPINEPHrine (EPIPEN 2-PAK) 0.3 mg/0.3 mL IJ SOAJ injection Inject 0.3 mLs (0.3 mg total) into the muscle once. 03/30/15   Audelia Hives Gertude Benito, PA  famotidine (PEPCID) 20 MG tablet Take 1 tablet (20 mg total) by mouth 2 (two) times daily. X 5 days 03/30/15   Audelia Hives Elveria Lauderbaugh, PA  hydrochlorothiazide (HYDRODIURIL) 25 MG tablet Take 1 tablet (25 mg total) by mouth  daily. 01/30/12 01/29/13  Gerda Diss, DO  hydrOXYzine (ATARAX/VISTARIL) 25 MG tablet Take 1 tablet (25 mg total) by mouth 3 (three) times daily as needed for itching. 03/30/15   Lutricia Feil, PA  predniSONE (DELTASONE) 10 MG tablet Beginning 03/31/2015, take 4 tabs po QD day 1, 3 tabs po QD day 2, 2 tabs po QD day 3 and 1 tab po QD day 4 then stop 03/30/15   Annett Gula H Gracelyn Coventry, PA   BP 154/91 mmHg  Pulse 81  Temp(Src) 97.7 F (36.5 C) (Oral)  Resp 16  SpO2 100% Physical Exam  Constitutional: He is oriented to person, place, and time. He appears well-developed and well-nourished. No distress.  HENT:  Head: Normocephalic and atraumatic.  Mouth/Throat: Uvula is midline, oropharynx is clear and moist and mucous membranes are normal. No uvula swelling.  Eyes: Conjunctivae are normal.  Neck: Normal range of motion. Neck supple.  Cardiovascular: Normal rate, regular rhythm and normal heart sounds.   Pulmonary/Chest: Effort normal and breath sounds normal. No stridor. No respiratory distress. He has no wheezes.  Musculoskeletal: Normal range of motion.  Neurological: He is alert and oriented to person, place, and time.  Skin: Skin is warm, dry and intact. There is erythema.     Outlined area with erythema and mild STS CSM exam of left hand is normal. No clinical indication of compartment syndrome. No visible retained stinger.  Psychiatric: He has a normal mood and affect. His behavior is normal.  Nursing note and vitals reviewed.   ED Course  Procedures (including critical care time) Labs Review Labs Reviewed - No data to display  Imaging Review No results found.   MDM   1. Bee sting reaction, accidental or unintentional, initial encounter   given oral doses of prednisone, benadryl and pepcid at [facility] and Rx to continue prednisone, pepcid and atarax at home for the next 5 days. Given the reported severity of initial reactions, patient also provided Rx for Epi Pen (2  pack kit) to carry with him. Return precautions reviewed and patient voices understanding.     Lutricia Feil, [place] 03/30/15 [phone]

## 2015-03-30 NOTE — ED Notes (Addendum)
PT A+Ox4, reports was stung by a bee yesterday, reports had swelling to LFA at that time.  Pt reports was seen at UC this AM and reports swelling has progressed from forearm into hand since.  Pt denies cp/palpitations, SOB.  No difficulty maintaining airway.  Speaking full/clear sentences, rr even/un-lab.  Skin PWD.  Redness/swelling noted to FA/hand.  NAD.

## 2015-03-30 NOTE — ED Notes (Signed)
Pt waiting for case mgr.

## 2015-06-11 ENCOUNTER — Emergency Department (HOSPITAL_COMMUNITY)
Admission: EM | Admit: 2015-06-11 | Discharge: 2015-06-12 | Disposition: A | Discharge status: Home / Self Care | Payer: Self-pay | Attending: Emergency Medicine | Admitting: Emergency Medicine

## 2015-06-11 ENCOUNTER — Encounter (HOSPITAL_COMMUNITY): Payer: Self-pay

## 2015-06-11 DIAGNOSIS — L509 Urticaria, unspecified: Secondary | ICD-10-CM | POA: Insufficient documentation

## 2015-06-11 DIAGNOSIS — I10 Essential (primary) hypertension: Secondary | ICD-10-CM | POA: Insufficient documentation

## 2015-06-11 DIAGNOSIS — Z720 Tobacco use: Secondary | ICD-10-CM | POA: Insufficient documentation

## 2015-06-11 NOTE — ED Notes (Signed)
Pt was stung by a bee on his neck and now he has whelps all over his body

## 2015-06-11 NOTE — ED Provider Notes (Signed)
CSN: [id number]     Arrival date & time 06/11/15  2243 History  This chart was scribed for Elpidio Anis, PA-C working with Zadie Rhine, MD by Evon Slack, ED Scribe. This patient was seen in room WTR9/WTR9 and the patient's care was started at 11:35 PM.     Chief Complaint  Patient presents with  . Rash    The history is provided by the patient. No language interpreter was used.   HPI Comments: Daniel Shepard is a 34 y.o. male who presents to the Emergency Department complaining of allergic reaction onset today at 9:30 PM. Pt states that he was stung by something and instantly began itching. Pt states that he has a diffuse rash now. 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 Pt states that his reaction is worse than previously. Pt states that he has tried children's benadryl with no relief. Pt denies SOB, trouble swallowing or facial swelling.   Past Medical History  Diagnosis Date  . Substance abuse   . Hypertension     Noted in 2007 but untreated   History reviewed. No pertinent past surgical history. Family History  Problem Relation Age of Onset  . Healthy Mother   . Healthy Father   . Healthy Maternal Grandfather   . Healthy Brother   . Healthy Sister    History  Substance Use Topics  . Smoking status: Current Every Day Smoker -- 0.50 packs/day    Types: Cigarettes  . Smokeless tobacco: Never Used  . Alcohol Use: Yes     Comment: pt reports 6 beers daily    Review of Systems  HENT: Negative for facial swelling and trouble swallowing.   Respiratory: Negative for shortness of breath.   Skin: Positive for rash.      Allergies  Review of patient's allergies indicates no known allergies.  Home Medications   Prior to Admission medications   Medication Sig Start Date End Date Taking? Authorizing Provider  EPINEPHrine (EPIPEN 2-PAK) 0.3 mg/0.3 mL IJ SOAJ injection Inject 0.3 mLs (0.3 mg total) into the muscle once. 03/30/15   Mathis Fare Presson, PA  famotidine (PEPCID) 20 MG tablet Take 1 tablet (20 mg total) by mouth 2 (two) times daily. X 5 days 03/30/15   Mathis Fare Presson, PA  hydrochlorothiazide (HYDRODIURIL) 25 MG tablet Take 1 tablet (25 mg total) by mouth daily. 01/30/12 01/29/13  Andrena Mews, DO  hydrOXYzine (ATARAX/VISTARIL) 25 MG tablet Take 1 tablet (25 mg total) by mouth 3 (three) times daily as needed for itching. 03/30/15   Ria Clock, PA  predniSONE (DELTASONE) 10 MG tablet Beginning 03/31/2015, take 4 tabs po QD day 1, 3 tabs po QD day 2, 2 tabs po QD day 3 and 1 tab po QD day 4 then stop 03/30/15   Jess Barters H Presson, PA   BP 163/106 mmHg  Pulse 97  Temp(Src) 98.5 F (36.9 C) (Oral)  Resp 20  Ht  (1.727 m)  Wt 175 lb (79.379 kg)  BMI 26.61 kg/m2  SpO2 100%   Physical Exam  Constitutional: He is oriented to person, place, and time. He appears well-developed and well-nourished. No distress.  HENT:  Head: Normocephalic and atraumatic.  Mouth/Throat: Oropharynx is clear and moist.  Eyes: Conjunctivae and EOM are normal.  Neck: Neck supple. No tracheal deviation present.  Cardiovascular: Normal rate.   Pulmonary/Chest: Effort normal and breath sounds normal. No respiratory distress. He has  no wheezes. He has no rhonchi. He has no rales.  Musculoskeletal: Normal range of motion.  Neurological: He is alert and oriented to person, place, and time.  Skin: Skin is warm and dry. Rash noted.  Raised red rash in confluent patches consistent with hives.   Psychiatric: He has a normal mood and affect. His behavior is normal.  Nursing note and vitals reviewed.   ED Course  Procedures (including critical care time) DIAGNOSTIC STUDIES: Oxygen Saturation is 100% on RA, normal by my interpretation.    COORDINATION OF CARE: 12:03 AM-Discussed treatment plan with pt at bedside and pt agreed to plan.     Labs Review Labs Reviewed - No data to display  Imaging Review No results  found.   EKG Interpretation None      MDM   Final diagnoses:  None   1. Hives  Uncomplicated allergic reaction with presentation limited to rash. EpiPen provided in ED - he was given one previously but couldn't afford the Rx. Reaction is worse than previous reaction, but stable at present.    I personally performed the services described in this documentation, which was scribed in my presence. The recorded information has been reviewed and is accurate.       Elpidio AnisShari Hezekiah Veltre, PA-C 06/14/15 2131  Tilden FossaElizabeth Rees, MD 06/15/15 [phone]

## 2015-06-12 MED ORDER — PREDNISONE 20 MG PO TABS
60.0000 mg | ORAL_TABLET | Freq: Once | ORAL | Status: AC
  Administered 2015-06-12: 60 mg via ORAL
  Filled 2015-06-12: qty 3

## 2015-06-12 MED ORDER — DIPHENHYDRAMINE HCL 25 MG PO CAPS
25.0000 mg | ORAL_CAPSULE | Freq: Once | ORAL | Status: AC
  Administered 2015-06-12: 25 mg via ORAL
  Filled 2015-06-12: qty 1

## 2015-06-12 MED ORDER — FAMOTIDINE 20 MG PO TABS: 20.0000 mg | ORAL_TABLET | Freq: Two times a day (BID) | ORAL | Status: DC

## 2015-06-12 MED ORDER — EPINEPHRINE 0.3 MG/0.3ML IJ SOAJ
0.3000 mg | Freq: Once | INTRAMUSCULAR | Status: AC
  Administered 2015-06-12: 0.3 mg via INTRAMUSCULAR
  Filled 2015-06-12: qty 0.3

## 2015-06-12 MED ORDER — DIPHENHYDRAMINE HCL 25 MG PO TABS: 25.0000 mg | ORAL_TABLET | Freq: Four times a day (QID) | ORAL | Status: DC | PRN

## 2015-06-12 MED ORDER — FAMOTIDINE 20 MG PO TABS
20.0000 mg | ORAL_TABLET | Freq: Once | ORAL | Status: AC
  Administered 2015-06-12: 20 mg via ORAL
  Filled 2015-06-12: qty 1

## 2015-06-12 MED ORDER — PREDNISONE 20 MG PO TABS: 60.0000 mg | ORAL_TABLET | Freq: Every day | ORAL | Status: DC

## 2015-06-12 NOTE — Discharge Instructions (Signed)
Hives Hives are itchy, red, swollen areas of the skin. They can vary in size and location on your body. Hives can come and go for hours or several days (acute hives) or for several weeks (chronic hives). Hives do not spread from person to person (noncontagious). They may get worse with scratching, exercise, and emotional stress. CAUSES   Allergic reaction to food, additives, or drugs.  Infections, including the common cold.  Illness, such as vasculitis, lupus, or thyroid disease.  Exposure to sunlight, heat, or cold.  Exercise.  Stress.  Contact with chemicals. SYMPTOMS   Red or white swollen patches on the skin. The patches may change size, shape, and location quickly and repeatedly.  Itching.  Swelling of the hands, feet, and face. This may occur if hives develop deeper in the skin. DIAGNOSIS  Your caregiver can usually tell what is wrong by performing a physical exam. Skin or blood tests may also be done to determine the cause of your hives. In some cases, the cause cannot be determined. TREATMENT  Mild cases usually get better with medicines such as antihistamines. Severe cases may require an emergency epinephrine injection. If the cause of your hives is known, treatment includes avoiding that trigger.  HOME CARE INSTRUCTIONS   Avoid causes that trigger your hives.  Take antihistamines as directed by your caregiver to reduce the severity of your hives. Non-sedating or low-sedating antihistamines are usually recommended. Do not drive while taking an antihistamine.  Take any other medicines prescribed for itching as directed by your caregiver.  Wear loose-fitting clothing.  Keep all follow-up appointments as directed by your caregiver. SEEK MEDICAL CARE IF:   You have persistent or severe itching that is not relieved with medicine.  You have painful or swollen joints. SEEK IMMEDIATE MEDICAL CARE IF:   You have a fever.  Your tongue or lips are swollen.  You have  trouble breathing or swallowing.  You feel tightness in the throat or chest.  You have abdominal pain. These problems may be the first sign of a life-threatening allergic reaction. Call your local emergency services (911 in [place] MAKE SURE YOU:   Understand these instructions.  Will watch your condition.  Will get help right away if you are not doing well or get worse. Document Released: 11/28/2005 Document Revised: 12/03/2013 Document Reviewed: 02/21/2012 ExitCare Patient Information 2015 [employer]. This information is not intended to replace advice given to you by your health care provider. Make sure you discuss any questions you have with your health care provider.  

## 2015-10-08 ENCOUNTER — Emergency Department (HOSPITAL_COMMUNITY): Payer: Self-pay

## 2015-10-08 ENCOUNTER — Encounter (HOSPITAL_COMMUNITY): Payer: Self-pay | Admitting: Emergency Medicine

## 2015-10-08 ENCOUNTER — Inpatient Hospital Stay (HOSPITAL_COMMUNITY)
Admission: EM | Admit: 2015-10-08 | Discharge: 2015-10-13 | DRG: 959 | Disposition: A | Discharge status: Home / Self Care | Payer: Self-pay | Attending: General Surgery | Admitting: General Surgery

## 2015-10-08 DIAGNOSIS — S02670A Fracture of alveolus of mandible, unspecified side, initial encounter for closed fracture: Secondary | ICD-10-CM | POA: Diagnosis present

## 2015-10-08 DIAGNOSIS — S065X9A Traumatic subdural hemorrhage with loss of consciousness of unspecified duration, initial encounter: Secondary | ICD-10-CM | POA: Diagnosis present

## 2015-10-08 DIAGNOSIS — S32009A Unspecified fracture of unspecified lumbar vertebra, initial encounter for closed fracture: Secondary | ICD-10-CM | POA: Diagnosis present

## 2015-10-08 DIAGNOSIS — S02622A Fracture of subcondylar process of left mandible, initial encounter for closed fracture: Principal | ICD-10-CM | POA: Diagnosis present

## 2015-10-08 DIAGNOSIS — F10129 Alcohol abuse with intoxication, unspecified: Secondary | ICD-10-CM | POA: Diagnosis present

## 2015-10-08 DIAGNOSIS — F17200 Nicotine dependence, unspecified, uncomplicated: Secondary | ICD-10-CM | POA: Diagnosis present

## 2015-10-08 DIAGNOSIS — Y908 Blood alcohol level of 240 mg/100 ml or more: Secondary | ICD-10-CM | POA: Diagnosis present

## 2015-10-08 DIAGNOSIS — S022XXB Fracture of nasal bones, initial encounter for open fracture: Secondary | ICD-10-CM

## 2015-10-08 DIAGNOSIS — S0292XA Unspecified fracture of facial bones, initial encounter for closed fracture: Secondary | ICD-10-CM | POA: Diagnosis present

## 2015-10-08 DIAGNOSIS — F10929 Alcohol use, unspecified with intoxication, unspecified: Secondary | ICD-10-CM | POA: Diagnosis present

## 2015-10-08 DIAGNOSIS — S022XXA Fracture of nasal bones, initial encounter for closed fracture: Secondary | ICD-10-CM | POA: Diagnosis present

## 2015-10-08 DIAGNOSIS — S27329A Contusion of lung, unspecified, initial encounter: Secondary | ICD-10-CM | POA: Diagnosis present

## 2015-10-08 LAB — COMPREHENSIVE METABOLIC PANEL
ALT: 46 U/L (ref 17–63)
AST: 68 U/L — ABNORMAL HIGH (ref 15–41)
Albumin: 4 g/dL (ref 3.5–5.0)
Alkaline Phosphatase: 126 U/L (ref 38–126)
Anion gap: 14 (ref 5–15)
BUN: <5 mg/dL — ABNORMAL LOW (ref 6–20)
CHLORIDE: 104 mmol/L (ref 101–111)
CO2: 22 mmol/L (ref 22–32)
CREATININE: 0.83 mg/dL (ref 0.61–1.24)
Calcium: 8.8 mg/dL — ABNORMAL LOW (ref 8.9–10.3)
Glucose, Bld: 103 mg/dL — ABNORMAL HIGH (ref 65–99)
POTASSIUM: 5 mmol/L (ref 3.5–5.1)
Sodium: 140 mmol/L (ref 135–145)
Total Bilirubin: 1.1 mg/dL (ref 0.3–1.2)
Total Protein: 6.8 g/dL (ref 6.5–8.1)

## 2015-10-08 LAB — CBC
HEMATOCRIT: 41.7 % (ref 39.0–52.0)
Hemoglobin: 14.4 g/dL (ref 13.0–17.0)
MCH: 29.8 pg (ref 26.0–34.0)
MCHC: 34.5 g/dL (ref 30.0–36.0)
MCV: 86.2 fL (ref 78.0–100.0)
PLATELETS: 331 10*3/uL (ref 150–400)
RBC: 4.84 MIL/uL (ref 4.22–5.81)
RDW: 15.2 % (ref 11.5–15.5)
WBC: 6.2 10*3/uL (ref 4.0–10.5)

## 2015-10-08 LAB — SAMPLE TO BLOOD BANK

## 2015-10-08 LAB — PROTIME-INR
INR: 0.98 (ref 0.00–1.49)
Prothrombin Time: 13.2 seconds (ref 11.6–15.2)

## 2015-10-08 LAB — ETHANOL: Alcohol, Ethyl (B): 322 mg/dL (ref <5)

## 2015-10-08 LAB — CDS SEROLOGY

## 2015-10-08 MED ORDER — SODIUM CHLORIDE 0.9 % IV SOLN
3.0000 g | Freq: Once | INTRAVENOUS | Status: AC
  Administered 2015-10-09: 3 g via INTRAVENOUS
  Filled 2015-10-08: qty 3
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
## 2015-10-08 NOTE — ED Notes (Signed)
Patient is resting comfortably. 
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
## 2015-10-09 ENCOUNTER — Inpatient Hospital Stay (HOSPITAL_COMMUNITY): Payer: Self-pay | Admitting: Anesthesiology

## 2015-10-09 ENCOUNTER — Inpatient Hospital Stay (HOSPITAL_COMMUNITY): Payer: No Typology Code available for payment source | Admitting: Anesthesiology

## 2015-10-09 ENCOUNTER — Encounter (HOSPITAL_COMMUNITY): Admission: EM | Disposition: A | Discharge status: Home / Self Care | Payer: Self-pay | Source: Home / Self Care

## 2015-10-09 ENCOUNTER — Emergency Department (HOSPITAL_COMMUNITY): Payer: Self-pay

## 2015-10-09 ENCOUNTER — Encounter (HOSPITAL_COMMUNITY): Payer: Self-pay | Admitting: Radiology

## 2015-10-09 DIAGNOSIS — S02670A Fracture of alveolus of mandible, unspecified side, initial encounter for closed fracture: Secondary | ICD-10-CM | POA: Diagnosis present

## 2015-10-09 HISTORY — PX: ORIF MANDIBULAR FRACTURE: SHX2127

## 2015-10-09 HISTORY — PX: CLOSED REDUCTION NASAL FRACTURE: SHX5365

## 2015-10-09 LAB — CBC
HCT: 40.1 % (ref 39.0–52.0)
Hemoglobin: 13.1 g/dL (ref 13.0–17.0)
MCH: 28.8 pg (ref 26.0–34.0)
MCHC: 32.7 g/dL (ref 30.0–36.0)
MCV: 88.1 fL (ref 78.0–100.0)
PLATELETS: 288 10*3/uL (ref 150–400)
RBC: 4.55 MIL/uL (ref 4.22–5.81)
RDW: 15.5 % (ref 11.5–15.5)
WBC: 9.7 10*3/uL (ref 4.0–10.5)

## 2015-10-09 LAB — BASIC METABOLIC PANEL
ANION GAP: 16 — AB (ref 5–15)
CALCIUM: 9.1 mg/dL (ref 8.9–10.3)
CHLORIDE: 103 mmol/L (ref 101–111)
CO2: 22 mmol/L (ref 22–32)
CREATININE: 0.64 mg/dL (ref 0.61–1.24)
GFR calc Af Amer: >60 mL/min (ref >60)
Glucose, Bld: 115 mg/dL — ABNORMAL HIGH (ref 65–99)
Potassium: 3.9 mmol/L (ref 3.5–5.1)
Sodium: 141 mmol/L (ref 135–145)

## 2015-10-09 SURGERY — OPEN REDUCTION INTERNAL FIXATION (ORIF) MANDIBULAR FRACTURE
Anesthesia: General | Site: Nose

## 2015-10-09 MED ORDER — LABETALOL HCL 5 MG/ML IV SOLN
20.0000 mg | Freq: Once | INTRAVENOUS | Status: AC
Start: 2015-10-09 — End: 2015-10-09
  Administered 2015-10-09: 20 mg via INTRAVENOUS

## 2015-10-09 MED ORDER — PANTOPRAZOLE SODIUM 40 MG IV SOLR
40.0000 mg | Freq: Every day | INTRAVENOUS | Status: DC
  Administered 2015-10-09 – 2015-10-11 (×3): 40 mg via INTRAVENOUS
  Filled 2015-10-09 (×3): qty 40

## 2015-10-09 MED ORDER — HYDRALAZINE HCL 20 MG/ML IJ SOLN
10.0000 mg | INTRAMUSCULAR | Status: DC | PRN
  Administered 2015-10-09 – 2015-10-12 (×5): 10 mg via INTRAVENOUS
  Filled 2015-10-09 (×5): qty 1

## 2015-10-09 MED ORDER — LACTATED RINGERS IV SOLN
INTRAVENOUS | Status: DC | PRN
  Administered 2015-10-09 (×2): via INTRAVENOUS

## 2015-10-09 MED ORDER — ROCURONIUM BROMIDE 50 MG/5ML IV SOLN
INTRAVENOUS | Status: AC
  Filled 2015-10-09: qty 1

## 2015-10-09 MED ORDER — LABETALOL HCL 5 MG/ML IV SOLN
INTRAVENOUS | Status: AC
  Filled 2015-10-09: qty 4

## 2015-10-09 MED ORDER — PANTOPRAZOLE SODIUM 40 MG PO TBEC: 40.0000 mg | DELAYED_RELEASE_TABLET | Freq: Every day | ORAL | Status: DC

## 2015-10-09 MED ORDER — CETYLPYRIDINIUM CHLORIDE 0.05 % MT LIQD
7.0000 mL | Freq: Two times a day (BID) | OROMUCOSAL | Status: DC
  Administered 2015-10-09 – 2015-10-13 (×8): 7 mL via OROMUCOSAL

## 2015-10-09 MED ORDER — LABETALOL HCL 5 MG/ML IV SOLN
INTRAVENOUS | Status: DC | PRN
  Administered 2015-10-09 (×2): 10 mg via INTRAVENOUS

## 2015-10-09 MED ORDER — KCL IN DEXTROSE-NACL 20-5-0.45 MEQ/L-%-% IV SOLN
INTRAVENOUS | Status: DC
  Administered 2015-10-09 – 2015-10-10 (×3): via INTRAVENOUS
  Administered 2015-10-10: 1000 mL via INTRAVENOUS
  Administered 2015-10-11 (×3): via INTRAVENOUS
  Filled 2015-10-09 (×11): qty 1000

## 2015-10-09 MED ORDER — ROCURONIUM BROMIDE 100 MG/10ML IV SOLN
INTRAVENOUS | Status: DC | PRN
  Administered 2015-10-09: 40 mg via INTRAVENOUS

## 2015-10-09 MED ORDER — ONDANSETRON HCL 4 MG/2ML IJ SOLN
INTRAMUSCULAR | Status: AC
  Filled 2015-10-09: qty 2

## 2015-10-09 MED ORDER — OXYMETAZOLINE HCL 0.05 % NA SOLN
NASAL | Status: DC | PRN
  Administered 2015-10-09: 3 via NASAL

## 2015-10-09 MED ORDER — FENTANYL CITRATE (PF) 100 MCG/2ML IJ SOLN
INTRAMUSCULAR | Status: DC | PRN
  Administered 2015-10-09: 50 ug via INTRAVENOUS
  Administered 2015-10-09: 100 ug via INTRAVENOUS
  Administered 2015-10-09: 50 ug via INTRAVENOUS
  Administered 2015-10-09: 100 ug via INTRAVENOUS

## 2015-10-09 MED ORDER — ARTIFICIAL TEARS OP OINT
TOPICAL_OINTMENT | OPHTHALMIC | Status: AC
  Filled 2015-10-09: qty 3.5

## 2015-10-09 MED ORDER — GLYCOPYRROLATE 0.2 MG/ML IJ SOLN
INTRAMUSCULAR | Status: AC
  Filled 2015-10-09: qty 2

## 2015-10-09 MED ORDER — ONDANSETRON HCL 4 MG/2ML IJ SOLN
INTRAMUSCULAR | Status: DC | PRN
  Administered 2015-10-09: 4 mg via INTRAVENOUS

## 2015-10-09 MED ORDER — HYDRALAZINE HCL 20 MG/ML IJ SOLN
5.0000 mg | Freq: Once | INTRAMUSCULAR | Status: AC
  Administered 2015-10-09: 5 mg via INTRAVENOUS

## 2015-10-09 MED ORDER — GLYCOPYRROLATE 0.2 MG/ML IJ SOLN
INTRAMUSCULAR | Status: DC | PRN
  Administered 2015-10-09: 0.4 mg via INTRAVENOUS

## 2015-10-09 MED ORDER — LACTATED RINGERS IV SOLN: INTRAVENOUS | Status: DC

## 2015-10-09 MED ORDER — HYDROCODONE-ACETAMINOPHEN 7.5-325 MG/15ML PO SOLN
15.0000 mL | ORAL | Status: DC | PRN
  Administered 2015-10-10 – 2015-10-11 (×5): 15 mL via ORAL
  Filled 2015-10-09 (×6): qty 15

## 2015-10-09 MED ORDER — FENTANYL CITRATE (PF) 250 MCG/5ML IJ SOLN
INTRAMUSCULAR | Status: AC
  Filled 2015-10-09: qty 5

## 2015-10-09 MED ORDER — MORPHINE SULFATE (PF) 2 MG/ML IV SOLN: 2.0000 mg | INTRAVENOUS | Status: DC | PRN

## 2015-10-09 MED ORDER — HYDROMORPHONE HCL 1 MG/ML IJ SOLN
1.0000 mg | INTRAMUSCULAR | Status: DC | PRN
  Administered 2015-10-09 – 2015-10-10 (×3): 1 mg via INTRAVENOUS
  Filled 2015-10-09 (×4): qty 1

## 2015-10-09 MED ORDER — ONDANSETRON HCL 4 MG/2ML IJ SOLN
4.0000 mg | Freq: Four times a day (QID) | INTRAMUSCULAR | Status: DC | PRN
  Administered 2015-10-10: 4 mg via INTRAVENOUS
  Filled 2015-10-09: qty 2

## 2015-10-09 MED ORDER — ALBUTEROL SULFATE HFA 108 (90 BASE) MCG/ACT IN AERS
INHALATION_SPRAY | RESPIRATORY_TRACT | Status: AC
  Filled 2015-10-09: qty 6.7

## 2015-10-09 MED ORDER — PROMETHAZINE HCL 25 MG/ML IJ SOLN: 6.2500 mg | INTRAMUSCULAR | Status: DC | PRN

## 2015-10-09 MED ORDER — NEOSTIGMINE METHYLSULFATE 10 MG/10ML IV SOLN
INTRAVENOUS | Status: DC | PRN
  Administered 2015-10-09: 3 mg via INTRAVENOUS

## 2015-10-09 MED ORDER — SODIUM CHLORIDE 0.9 % IV BOLUS (SEPSIS)
1000.0000 mL | Freq: Once | INTRAVENOUS | Status: AC
  Administered 2015-10-09: 1000 mL via INTRAVENOUS

## 2015-10-09 MED ORDER — ONDANSETRON HCL 4 MG PO TABS: 4.0000 mg | ORAL_TABLET | Freq: Four times a day (QID) | ORAL | Status: DC | PRN

## 2015-10-09 MED ORDER — MIDAZOLAM HCL 2 MG/2ML IJ SOLN
INTRAMUSCULAR | Status: AC
  Filled 2015-10-09: qty 4

## 2015-10-09 MED ORDER — BACITRACIN ZINC 500 UNIT/GM EX OINT
TOPICAL_OINTMENT | CUTANEOUS | Status: AC
  Filled 2015-10-09: qty 28.35

## 2015-10-09 MED ORDER — PROPOFOL 10 MG/ML IV BOLUS
INTRAVENOUS | Status: AC
  Filled 2015-10-09: qty 20

## 2015-10-09 MED ORDER — METHOCARBAMOL 500 MG PO TABS
1000.0000 mg | ORAL_TABLET | Freq: Three times a day (TID) | ORAL | Status: DC | PRN
  Administered 2015-10-12: 1000 mg via ORAL
  Filled 2015-10-09 (×2): qty 2

## 2015-10-09 MED ORDER — CEFAZOLIN SODIUM-DEXTROSE 2-3 GM-% IV SOLR
INTRAVENOUS | Status: DC | PRN
  Administered 2015-10-09: 2 g via INTRAVENOUS

## 2015-10-09 MED ORDER — LIDOCAINE-EPINEPHRINE 1 %-1:100000 IJ SOLN
INTRAMUSCULAR | Status: DC | PRN
  Administered 2015-10-09: 7 mL

## 2015-10-09 MED ORDER — MORPHINE SULFATE (PF) 2 MG/ML IV SOLN
1.0000 mg | INTRAVENOUS | Status: DC | PRN
  Administered 2015-10-09 – 2015-10-10 (×7): 2 mg via INTRAVENOUS
  Filled 2015-10-09 (×8): qty 1

## 2015-10-09 MED ORDER — NEOSTIGMINE METHYLSULFATE 10 MG/10ML IV SOLN
INTRAVENOUS | Status: AC
  Filled 2015-10-09: qty 1

## 2015-10-09 MED ORDER — 0.9 % SODIUM CHLORIDE (POUR BTL) OPTIME
TOPICAL | Status: DC | PRN
  Administered 2015-10-09: 1000 mL

## 2015-10-09 MED ORDER — PROPOFOL 10 MG/ML IV BOLUS
INTRAVENOUS | Status: DC | PRN
  Administered 2015-10-09: 100 mg via INTRAVENOUS
  Administered 2015-10-09: 200 mg via INTRAVENOUS

## 2015-10-09 MED ORDER — MIDAZOLAM HCL 5 MG/5ML IJ SOLN
INTRAMUSCULAR | Status: DC | PRN
  Administered 2015-10-09: 2 mg via INTRAVENOUS

## 2015-10-09 MED ORDER — LIDOCAINE HCL (CARDIAC) 20 MG/ML IV SOLN
INTRAVENOUS | Status: DC | PRN
  Administered 2015-10-09: 100 mg via INTRAVENOUS

## 2015-10-09 MED ORDER — IOHEXOL 300 MG/ML  SOLN
100.0000 mL | Freq: Once | INTRAMUSCULAR | Status: AC | PRN
Start: 2015-10-09 — End: 2015-10-09
  Administered 2015-10-09: 100 mL via INTRAVENOUS

## 2015-10-09 MED ORDER — HYDRALAZINE HCL 20 MG/ML IJ SOLN
INTRAMUSCULAR | Status: AC
  Filled 2015-10-09: qty 1

## 2015-10-09 MED ORDER — LIDOCAINE-EPINEPHRINE 1 %-1:100000 IJ SOLN
INTRAMUSCULAR | Status: AC
  Filled 2015-10-09: qty 1

## 2015-10-09 SURGICAL SUPPLY — 63 items
APL SKNCLS STERI-STRIP NONHPOA (GAUZE/BANDAGES/DRESSINGS) ×3
BENZOIN TINCTURE PRP APPL 2/3 (GAUZE/BANDAGES/DRESSINGS) ×3 IMPLANT
BIT DRILL 1.6X5 (BIT) ×3
BIT DRILL 1.6X5MM (BIT) ×1 IMPLANT
BIT DRILL TWIST 1.3X35MM (BIT) ×1 IMPLANT
BLADE SURG 15 STRL LF DISP TIS (BLADE) IMPLANT
BLADE SURG 15 STRL SS (BLADE)
BLADE SURG ROTATE 9660 (MISCELLANEOUS) IMPLANT
CANISTER SUCTION 2500CC (MISCELLANEOUS) ×5 IMPLANT
CLEANER TIP ELECTROSURG 2X2 (MISCELLANEOUS) ×5 IMPLANT
CLOSURE WOUND 1/2 X4 (GAUZE/BANDAGES/DRESSINGS) ×1
COVER SURGICAL LIGHT HANDLE (MISCELLANEOUS) ×5 IMPLANT
CRADLE DONUT ADULT HEAD (MISCELLANEOUS) IMPLANT
DECANTER SPIKE VIAL GLASS SM (MISCELLANEOUS) ×5 IMPLANT
DRAPE PROXIMA HALF (DRAPES) IMPLANT
DRILL BIT 1.6X5MM (BIT) ×5
DRILL TWIST 1.3X35MM (BIT) ×5
ELECT COATED BLADE 2.86 ST (ELECTRODE) ×5 IMPLANT
ELECT REM PT RETURN 9FT ADLT (ELECTROSURGICAL) ×5
ELECTRODE REM PT RTRN 9FT ADLT (ELECTROSURGICAL) ×3 IMPLANT
GLOVE BIO SURGEON STRL SZ 6.5 (GLOVE) ×2 IMPLANT
GLOVE BIO SURGEON STRL SZ7.5 (GLOVE) ×5 IMPLANT
GLOVE BIO SURGEONS STRL SZ 6.5 (GLOVE) ×1
GLOVE BIOGEL PI IND STRL 6.5 (GLOVE) ×1 IMPLANT
GLOVE BIOGEL PI INDICATOR 6.5 (GLOVE) ×2
GLOVE ECLIPSE 6.5 STRL STRAW (GLOVE) ×3 IMPLANT
GLOVE SS BIOGEL STRL SZ 6.5 (GLOVE) ×2 IMPLANT
GLOVE SUPERSENSE BIOGEL SZ 6.5 (GLOVE) ×4
GOWN STRL REUS W/ TWL LRG LVL3 (GOWN DISPOSABLE) ×6 IMPLANT
GOWN STRL REUS W/TWL LRG LVL3 (GOWN DISPOSABLE) ×10
KIT BASIN OR (CUSTOM PROCEDURE TRAY) ×5 IMPLANT
KIT ROOM TURNOVER OR (KITS) ×5 IMPLANT
NDL HYPO 25GX1X1/2 BEV (NEEDLE) IMPLANT
NEEDLE 27GAX1X1/2 (NEEDLE) ×5 IMPLANT
NEEDLE HYPO 25GX1X1/2 BEV (NEEDLE) IMPLANT
NS IRRIG 1000ML POUR BTL (IV SOLUTION) ×5 IMPLANT
PAD ARMBOARD 7.5X6 YLW CONV (MISCELLANEOUS) ×10 IMPLANT
PATTIES SURGICAL .5 X3 (DISPOSABLE) ×3 IMPLANT
PENCIL BUTTON HOLSTER BLD 10FT (ELECTRODE) ×5 IMPLANT
PLATE 4H FRACTURE (Plate) ×3 IMPLANT
PLATE HYBRID GOLD MMF (Plate) ×4 IMPLANT
PLATE HYBRID MMF GOLD (Plate) ×2 IMPLANT
SCISSORS WIRE ANG 4 3/4 DISP (INSTRUMENTS) ×5 IMPLANT
SCREW 2.0X12MM (Screw) ×3 IMPLANT
SCREW LOCK SELFDRIL 2.0X8M MMF (Screw) ×30 IMPLANT
SCREW MNDBLE 2.0X10 LOCKING (Screw) ×3 IMPLANT
SCREW MNDBLE 2.0X14 LOCKING (Screw) ×3 IMPLANT
SCREW MNDBLE 2.0X16 LOCKING (Screw) ×3 IMPLANT
STRIP CLOSURE SKIN 1/2X4 (GAUZE/BANDAGES/DRESSINGS) ×2 IMPLANT
SUT ETHILON 4 0 CL P 3 (SUTURE) IMPLANT
SUT MON AB 3-0 SH 27 (SUTURE) ×5
SUT MON AB 3-0 SH27 (SUTURE) ×5 IMPLANT
SUT PROLENE 6 0 PC 1 (SUTURE) IMPLANT
SUT STEEL 0 (SUTURE)
SUT STEEL 0 18XMFL TIE 17 (SUTURE) IMPLANT
SUT STEEL 1 (SUTURE) ×5 IMPLANT
SUT STEEL 2 (SUTURE) IMPLANT
SUT STEEL 4 (SUTURE) ×5 IMPLANT
SUT VICRYL 4-0 PS2 18IN ABS (SUTURE) IMPLANT
TOWEL OR 17X24 6PK STRL BLUE (TOWEL DISPOSABLE) ×5 IMPLANT
TRAY ENT MC OR (CUSTOM PROCEDURE TRAY) ×5 IMPLANT
TRAY FOLEY CATH 14FRSI W/METER (CATHETERS) IMPLANT
WATER STERILE IRR 1000ML POUR (IV SOLUTION) ×5 IMPLANT
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
## 2015-10-09 NOTE — Progress Notes (Addendum)
Patient scheduled for surgery later this AM.  Complaining of right hip pain.  Marta LamasJames O. Gae BonWyatt, III, MD, FACS [phone] (pager) [phone] (direct pager) Trauma Surgeon

## 2015-10-09 NOTE — Brief Op Note (Signed)
10/08/2015 - 10/09/2015  1:50 PM  PATIENT:  Daniel Shepard  34 y.o. male  PRE-OPERATIVE DIAGNOSIS:  mandible fracture, nasal fracture  POST-OPERATIVE DIAGNOSIS:  Mandible Fracture, nasal fracture  PROCEDURE:  Procedure(s): OPEN REDUCTION INTERNAL FIXATION (ORIF) MANDIBULAR FRACTURE (N/A) CLOSED REDUCTION MANDIBLE WITH MANDIBULOMAXILLARY FUSION (N/A) CLOSED REDUCTION NASAL FRACTURE (N/A)  SURGEON:  Surgeon(s) and Role:    * Christia Readingwight Mikayela Deats, MD - Primary  PHYSICIAN ASSISTANT:   ASSISTANTS: none   ANESTHESIA:   general  EBL:  Total I/O In: 250 [I.V.:250] Out: -   BLOOD ADMINISTERED:none  DRAINS: none   LOCAL MEDICATIONS USED:  LIDOCAINE   SPECIMEN:  No Specimen  DISPOSITION OF SPECIMEN:  N/A  COUNTS:  YES  TOURNIQUET:  * No tourniquets in log *  DICTATION: .Other Dictation: Dictation Number [id number]  PLAN OF CARE: Return to ICU  PATIENT DISPOSITION:  ICU - extubated and stable.   Delay start of Pharmacological VTE agent (>24hrs) due to surgical blood loss or risk of bleeding: no

## 2015-10-09 NOTE — Transfer of Care (Signed)
Immediate Anesthesia Transfer of Care Note  Patient: [name]  Procedure(s) Performed: Procedure(s): OPEN REDUCTION INTERNAL FIXATION (ORIF) MANDIBULAR/OMaxillary FRACTURE (N/A) CLOSED REDUCTION NASAL FRACTURE (N/A)  Patient Location: PACU  Anesthesia Type:General  Level of Consciousness: lethargic and responds to stimulation  Airway & Oxygen Therapy: Patient Spontanous Breathing and Patient connected to nasal cannula oxygen  Post-op Assessment: Report given to RN and Post -op Vital signs reviewed and stable  Post vital signs: Reviewed and stable, moving all extremities  Last Vitals:  Filed Vitals:   10/09/15 1000  BP: 170/94  Pulse: 84  Temp:   Resp: 25    Complications: No apparent anesthesia complications

## 2015-10-09 NOTE — Progress Notes (Signed)
   10/09/15 1607  Clinical Encounter Type  Visited With Family;Health care provider  Visit Type Initial  Spiritual Encounters  Spiritual Needs Prayer    Chaplain met with patient's mother and introduced spiritual care services. Chaplain support is available as needed.   Jeri Lager, Chaplain 10/09/2015 4:09 PM
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
## 2015-10-09 NOTE — Progress Notes (Signed)
177/108, sr 90 Dr Renold DonGermeroth aware, ok to tx back to [id number]

## 2015-10-09 NOTE — Anesthesia Procedure Notes (Addendum)
Procedure Name: Intubation Date/Time: 10/09/2015 12:26 PM Performed by: Margaree MackintoshYACOUB, Farrell Broerman B Pre-anesthesia Checklist: Patient identified, Timeout performed, Emergency Drugs available, Suction available and Patient being monitored Patient Re-evaluated:Patient Re-evaluated prior to inductionOxygen Delivery Method: Circle system utilized Preoxygenation: Pre-oxygenation with 100% oxygen Intubation Type: IV induction and Inhalational induction Ventilation: Mask ventilation without difficulty Laryngoscope Size: Mac and 4 Grade View: Grade II Nasal Tubes: Left, Nasal prep performed, Magill forceps- large, utilized and Nasal Rae Number of attempts: 1 Placement Confirmation: ETT inserted through vocal cords under direct vision,  positive ETCO2 and breath sounds checked- equal and bilateral Secured at: 27 cm Tube secured with: Tape Dental Injury: Teeth and Oropharynx as per pre-operative assessment    Intubation performed by Karlyne GreenspanBenjamin Judd, MD

## 2015-10-09 NOTE — Progress Notes (Signed)
Patient ID: Daniel Shepard, male   DOB: Dec 22, 1980, 34 y.o.   MRN: [id number] Head ct reviewed. This is not a lesion which will result in an operation, nor does it need a repeat scan. There is no shift, no change in the cerebrum at all.

## 2015-10-09 NOTE — Progress Notes (Addendum)
Pt removed C-collar in Emergency department. Pt educated and still refusing C-collar. Juleen Starraryn Hutton, RN witnessed.
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
## 2015-10-09 NOTE — Progress Notes (Signed)
Dr Renold DonGermeroth updated re elevated BP. New order for 20mg  IV Labetelol. Will cont to monitor closely.

## 2015-10-09 NOTE — Anesthesia Preprocedure Evaluation (Addendum)
Anesthesia Evaluation  Patient identified by MRN, date of birth, ID band Patient awake    Reviewed: Allergy & Precautions, H&P , NPO status , Patient's Chart, lab work & pertinent test results  History of Anesthesia Complications Negative for: history of anesthetic complications  Airway Mallampati: II  TM Distance: >3 FB Neck ROM: full    Dental no notable dental hx.    Pulmonary neg pulmonary ROS, Current Smoker,    Pulmonary exam normal breath sounds clear to auscultation       Cardiovascular negative cardio ROS Normal cardiovascular exam Rhythm:regular Rate:Normal     Neuro/Psych negative neurological ROS     GI/Hepatic negative GI ROS, Neg liver ROS,   Endo/Other  negative endocrine ROS  Renal/GU negative Renal ROS     Musculoskeletal   Abdominal   Peds  Hematology negative hematology ROS (+)   Anesthesia Other Findings   Reproductive/Obstetrics negative OB ROS                            Anesthesia Physical Anesthesia Plan  ASA: III  Anesthesia Plan: General   Post-op Pain Management:    Induction: Intravenous  Airway Management Planned: Oral ETT  Additional Equipment:   Intra-op Plan:   Post-operative Plan: Extubation in OR  Informed Consent: I have reviewed the patients History and Physical, chart, labs and discussed the procedure including the risks, benefits and alternatives for the proposed anesthesia with the patient or authorized representative who has indicated his/her understanding and acceptance.   Dental Advisory Given  Plan Discussed with: Anesthesiologist, CRNA and Surgeon  Anesthesia Plan Comments:        Anesthesia Quick Evaluation

## 2015-10-10 ENCOUNTER — Encounter (HOSPITAL_COMMUNITY): Payer: Self-pay

## 2015-10-10 LAB — TROPONIN I

## 2015-10-10 MED ORDER — HYDROMORPHONE HCL 1 MG/ML IJ SOLN
1.0000 mg | INTRAMUSCULAR | Status: DC | PRN
  Administered 2015-10-10 – 2015-10-11 (×3): 1 mg via INTRAVENOUS
  Filled 2015-10-10 (×3): qty 1

## 2015-10-10 MED ORDER — METOPROLOL TARTRATE 1 MG/ML IV SOLN
INTRAVENOUS | Status: AC
  Filled 2015-10-10: qty 5

## 2015-10-10 MED ORDER — NITROGLYCERIN 0.4 MG SL SUBL
SUBLINGUAL_TABLET | SUBLINGUAL | Status: AC
  Filled 2015-10-10: qty 1

## 2015-10-10 MED ORDER — METOPROLOL TARTRATE 1 MG/ML IV SOLN
5.0000 mg | Freq: Four times a day (QID) | INTRAVENOUS | Status: DC | PRN
  Administered 2015-10-10 – 2015-10-11 (×3): 5 mg via INTRAVENOUS
  Filled 2015-10-10 (×2): qty 5

## 2015-10-10 MED ORDER — ALUM & MAG HYDROXIDE-SIMETH 200-200-20 MG/5ML PO SUSP
30.0000 mL | ORAL | Status: DC | PRN
Start: 2015-10-10 — End: 2015-10-13
  Administered 2015-10-10: 30 mL via ORAL
  Filled 2015-10-10: qty 30
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
## 2015-10-10 NOTE — Progress Notes (Signed)
Patient ID: Daniel Shepard, male   DOB: Aug 18, 1981, 34 y.o.   MRN: [id number]  Awake and alert. MMF is in place and secure. No swelling or bleeding. Call if any problems.
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
## 2015-10-10 NOTE — Progress Notes (Signed)
Patient transferred from [facility]15M via wheelchair on tele by RN. Oriented to unit and room, instructed on callbell and placed at side. No family at bedside. Belongings with patient.

## 2015-10-11 MED ORDER — HYDROMORPHONE HCL 1 MG/ML IJ SOLN
1.0000 mg | INTRAMUSCULAR | Status: DC | PRN
  Administered 2015-10-11 – 2015-10-12 (×4): 1 mg via INTRAVENOUS
  Filled 2015-10-11 (×4): qty 1

## 2015-10-11 MED ORDER — DIPHENHYDRAMINE HCL 12.5 MG/5ML PO ELIX
50.0000 mg | ORAL_SOLUTION | Freq: Four times a day (QID) | ORAL | Status: DC | PRN
  Administered 2015-10-11 – 2015-10-13 (×3): 50 mg via ORAL
  Filled 2015-10-11 (×4): qty 20

## 2015-10-11 NOTE — Progress Notes (Signed)
Trauma Service Note  Subjective: Patient asleep, but arousable.  No distress.   Objective: Vital signs in last 24 hours: Temp:  [97.9 F (36.6 C)-101.2 F (38.4 C)] 98.1 F (36.7 C) (10/30 0700) Pulse Rate:  [80-150] 92 (10/30 0800) Resp:  [18-27] 23 (10/30 0800) BP: (134-160)/(78-100) 146/96 mmHg (10/30 0800) SpO2:  [93 %-99 %] 95 % (10/30 0800) Last BM Date: 10/09/15  Intake/Output from previous day: 10/29 0701 - 10/30 0700 In: 3120 [P.O.:120; I.V.:3000] Out: 1625 [Urine:1625] Intake/Output this shift: Total I/O In: 620 [P.O.:120; I.V.:500] Out: 675 [Urine:675]  General: No acute distress  Lungs: Clear  Abd: Benign  Extremities: Intact.  No clinical signs or symptoms of DVT  Neuro: Intact  Lab Results: CBC   Recent Labs  10/08/15 2032 10/09/15 0624  WBC 6.2 9.7  HGB 14.4 13.1  HCT 41.7 40.1  PLT 331 288   BMET  Recent Labs  10/08/15 2032 10/09/15 0624  NA 140 141  K 5.0 3.9  CL 104 103  CO2 22 22  GLUCOSE 103* 115*  BUN <5* <5*  CREATININE 0.83 0.64  CALCIUM 8.8* 9.1   PT/INR  Recent Labs  10/08/15 2032  LABPROT 13.2  INR 0.98   ABG No results for input(s): PHART, HCO3 in the last 72 hours.  Invalid input(s): PCO2, PO2  Studies/Results: No results found.  Anti-infectives: Anti-infectives    Start     Dose/Rate Route Frequency Ordered Stop   10/09/15 0000  Ampicillin-Sulbactam (UNASYN) 3 g in sodium chloride 0.9 % 100 mL IVPB     3 g 100 mL/hr over 60 Minutes Intravenous  Once 10/08/15 2350 10/09/15 0228      Assessment/Plan: s/p Procedure(s): OPEN REDUCTION INTERNAL FIXATION (ORIF) MANDIBULAR/OMaxillary FRACTURE CLOSED REDUCTION NASAL FRACTURE Plan for discharge tomorrow Transfer to the floor today.  LOS: 2 days   Marta LamasJames O. Gae BonWyatt, III, MD, FACS [phone] Trauma Surgeon 10/11/2015

## 2015-10-11 NOTE — Progress Notes (Signed)
Report called to Jolin, RN on 6N. Patient to be transferred to 6N12 via wheelchair by Crystal, NT. Belongings sent with patient. Family at bedside.

## 2015-10-12 ENCOUNTER — Encounter (HOSPITAL_COMMUNITY): Payer: Self-pay | Admitting: Otolaryngology

## 2015-10-12 DIAGNOSIS — F10929 Alcohol use, unspecified with intoxication, unspecified: Secondary | ICD-10-CM | POA: Diagnosis present

## 2015-10-12 DIAGNOSIS — S0292XA Unspecified fracture of facial bones, initial encounter for closed fracture: Secondary | ICD-10-CM | POA: Diagnosis present

## 2015-10-12 MED ORDER — BACITRACIN ZINC 500 UNIT/GM EX OINT
TOPICAL_OINTMENT | Freq: Two times a day (BID) | CUTANEOUS | Status: DC
  Administered 2015-10-12 – 2015-10-13 (×3): via TOPICAL
  Filled 2015-10-12: qty 28.35

## 2015-10-12 MED ORDER — HYDROMORPHONE HCL 1 MG/ML IJ SOLN
0.5000 mg | INTRAMUSCULAR | Status: DC | PRN
  Administered 2015-10-13: 0.5 mg via INTRAVENOUS
  Filled 2015-10-12: qty 1

## 2015-10-12 MED ORDER — HYDROCODONE-ACETAMINOPHEN 7.5-325 MG/15ML PO SOLN
10.0000 mL | ORAL | Status: DC | PRN
  Administered 2015-10-12: 20 mL via ORAL
  Filled 2015-10-12: qty 30

## 2015-10-12 MED ORDER — OXYCODONE HCL 5 MG/5ML PO SOLN
10.0000 mg | ORAL | Status: DC | PRN
  Administered 2015-10-12 – 2015-10-13 (×5): 20 mg via ORAL
  Filled 2015-10-12 (×7): qty 20
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
## 2015-10-13 DIAGNOSIS — S32009A Unspecified fracture of unspecified lumbar vertebra, initial encounter for closed fracture: Secondary | ICD-10-CM | POA: Diagnosis present

## 2015-10-13 MED ORDER — OXYCODONE HCL 5 MG/5ML PO SOLN: 10.0000 mg | ORAL | Status: DC | PRN

## 2015-10-13 MED ORDER — DIPHENHYDRAMINE HCL 12.5 MG/5ML PO ELIX: 50.0000 mg | ORAL_SOLUTION | Freq: Four times a day (QID) | ORAL | Status: DC | PRN

## 2015-10-13 NOTE — Discharge Planning (Signed)
Pt has decided to stay with hospitalized friend. Clarified that he is discharged. No further questions/concerns.

## 2015-10-13 NOTE — Progress Notes (Signed)
Discharge instructions reviewed with patient. Including prescriptions and wire cutter instructions. Emphasis on plan for PCP to address patient's request. Patient verbalizes understanding. Printed AVS, prescription, and wire cutter given to patient. Patient awaiting transportation home. Spoke with patient's mother re: discharge today. Mother  to arrange transportation.

## 2015-10-13 NOTE — Progress Notes (Signed)
[facility]t:  Appointment is Thursday, Nov. 10 at 9:00 with Julianne HandlerLaChina Hollis.  Appt information put on AVS.    Quintella BatonJulie W. Rabiah Goeser, RN, BSN  Trauma/Neuro ICU Case Manager [phone]

## 2015-10-13 NOTE — Discharge Instructions (Signed)
Keep wire cutters with you at all times. In the event of vomiting, cut wires and call Dr. Dionicio StallBates's office immediately.  No driving while taking oxycodone.
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
## 2015-10-18 NOTE — Anesthesia Postprocedure Evaluation (Signed)
  Anesthesia Post-op Note  Patient: Daniel Shepard  Procedure(s) Performed: Procedure(s) (LRB): OPEN REDUCTION INTERNAL FIXATION (ORIF) MANDIBULAR/OMaxillary FRACTURE (N/A) CLOSED REDUCTION NASAL FRACTURE (N/A)  Patient Location: PACU  Anesthesia Type: General  Level of Consciousness: awake and alert   Airway and Oxygen Therapy: Patient Spontanous Breathing  Post-op Pain: mild  Post-op Assessment: Post-op Vital signs reviewed, Patient's Cardiovascular Status Stable, Respiratory Function Stable, Patent Airway and No signs of Nausea or vomiting  Last Vitals:  Filed Vitals:   10/13/15 1410  BP: 142/87  Pulse: 85  Temp: 36.7 C  Resp: 18    Post-op Vital Signs: stable   Complications: No apparent anesthesia complications

## 2015-10-19 NOTE — Care Management (Signed)
Patient called stated he lost his MATCH letter . Patient requesting [facility] MATCH [facility] . [facility]ica [facility] faxed to [phone].   Called patient back [phone] , explained [facility] .   Ronny FlurryHeather Brookes Craine RN BSN [phone]

## 2015-10-22 ENCOUNTER — Encounter: Payer: Self-pay | Admitting: Family Medicine

## 2015-10-22 ENCOUNTER — Ambulatory Visit (INDEPENDENT_AMBULATORY_CARE_PROVIDER_SITE_OTHER): Payer: Self-pay | Admitting: Family Medicine

## 2015-10-22 VITALS — BP 122/88 | HR 87 | Temp 97.8°F | Resp 14 | Ht 68.0 in | Wt 154.0 lb

## 2015-10-22 DIAGNOSIS — Z23 Encounter for immunization: Secondary | ICD-10-CM

## 2015-10-22 DIAGNOSIS — S0292XD Unspecified fracture of facial bones, subsequent encounter for fracture with routine healing: Secondary | ICD-10-CM

## 2015-10-22 DIAGNOSIS — F17200 Nicotine dependence, unspecified, uncomplicated: Secondary | ICD-10-CM

## 2015-10-22 DIAGNOSIS — S02670A Fracture of alveolus of mandible, unspecified side, initial encounter for closed fracture: Secondary | ICD-10-CM
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
## 2015-10-22 NOTE — Patient Instructions (Signed)
Mandibular Fracture A mandibular fracture is a break in the jawbone. CAUSES  The most common cause of mandibular fracture is a direct blow (trauma) to the jaw. This could happen from:  A car crash.  Physical violence.  A fall from a high place. SYMPTOMS   Pain.  Swelling.  Difficulty and pain when closing the mouth.  Feeling that the teeth are not aligned properly when closing the mouth (malocclusion).  Difficulty speaking.  Difficulty swallowing. DIAGNOSIS  Your caregiver will take your history and perform a physical exam. He or she may also order imaging tests, such as X-rays or a computed tomography (CT) scan, to confirm your diagnosis. TREATMENT  Surgery is often needed to put the jaw back in the right position. Wires are usually placed around the teeth to hold the jaw in place while it heals. Treatment may also include pain medicine, ice, and a soft or liquid diet. HOME CARE INSTRUCTIONS   Put ice on the injured area.  Put ice in a plastic bag.  Place a towel between your skin and the bag.  Leave the ice on for 15-20 minutes, 03-04 times a day for the first 2 days.  Only take over-the-counter or prescription medicines for pain, discomfort, or fever as directed by your caregiver.  Eat a well-balanced, high-protein soft or liquid diet as directed by your caregiver.  If your jaws are wired, follow your caregiver's instructions for wired jaw care.  Sleep on your back to avoid putting pressure on your jaw.  Avoid exercising to the point that you become short of breath. SEEK MEDICAL CARE IF:   You have a severe headache or numbness in your face.  You have severe jaw pain that is not relieved with medicine.  Your jaw wires become loose.  You have uncontrollable nausea or anxiety.  Your swelling or redness gets worse. SEEK IMMEDIATE MEDICAL CARE IF:  You have a fever.  You have difficulty breathing.  You feel like your airway is tightening.  You cannot  swallow your saliva.  You make a high-pitched whistling sound when you breathe (wheezing). MAKE SURE YOU:   Understand these instructions.  Will watch your condition.  Will get help right away if you are not doing well or get worse.   This information is not intended to replace advice given to you by your health care provider. Make sure you discuss any questions you have with your health care provider.   Document Released: 11/28/2005 Document Revised: 12/19/2014 Document Reviewed: 12/14/2011 Elsevier Interactive Patient Education 2016 Elsevier Inc. Tobacco Use Disorder Tobacco use disorder (TUD) is a mental disorder. It is the long-term use of tobacco in spite of related health problems or difficulty with normal life activities. Tobacco is most commonly smoked as cigarettes and less commonly as cigars or pipes. Smokeless chewing tobacco and snuff are also popular. People with TUD get a feeling of extreme pleasure (euphoria) from using tobacco and have a desire to use it again and again. Repeated use of tobacco can cause problems. The addictive effects of tobacco are due mainly tothe ingredient nicotine. Nicotine also causes a rush of adrenaline (epinephrine) in the body. This leads to increased blood pressure, heart rate, and breathing rate. These changes may cause problems for people with high blood pressure, weak hearts, or lung disease. High doses of nicotine in children and pets can lead to seizures and death.  Tobacco contains a number of other unsafe chemicals. These chemicals are especially harmful when inhaled as  smoke and can damage almost every organ in the body. Smokers live shorter lives than nonsmokers and are at risk of dying from a number of diseases and cancers. Tobacco smoke can also cause health problems for nonsmokers (due to inhaling secondhand smoke). Smoking is also a fire hazard.  TUD usually starts in the late teenage years and is most common in young adults between the  ages of 3918 and 25 years. People who start smoking earlier in life are more likely to continue smoking as adults. TUD is somewhat more common in men than women. People with TUD are at higher risk for using alcohol and other drugs of abuse. RISK FACTORS Risk factors for TUD include:   Having family members with the disorder.  Being around people who use tobacco.  Having an existing mental health issue such as schizophrenia, depression, bipolar disorder, ADHD, or posttraumatic stress disorder (PTSD). SIGNS AND SYMPTOMS  People with tobacco use disorder have two or more of the following signs and symptoms within 12 months:   Use of more tobacco over a longer period than intended.   Not able to cut down or control tobacco use.   A lot of time spent obtaining or using tobacco.   Strong desire or urge to use tobacco (craving). Cravings may last for 6 months or longer after quitting.  Use of tobacco even when use leads to major problems at work, school, or home.   Use of tobacco even when use leads to relationship problems.   Giving up or cutting down on important life activities because of tobacco use.   Repeatedly using tobacco in situations where it puts you or others in physical danger, like smoking in bed.   Use of tobacco even when it is known that a physical or mental problem is likely related to tobacco use.   Physical problems are numerous and may include chronic bronchitis, emphysema, lung and other cancers, gum disease, high blood pressure, heart disease, and stroke.   Mental problems caused by tobacco may include difficulty sleeping and anxiety.  Need to use greater amounts of tobacco to get the same effect. This means you have developed a tolerance.   Withdrawal symptoms as a result of stopping or rapidly cutting back use. These symptoms may last a month or more after quitting and include the following:   Depressed, anxious, or irritable mood.   Difficulty  concentrating.   Increased appetite.  Restlessness or trouble sleeping.   Use of tobacco to avoid withdrawal symptoms. DIAGNOSIS  Tobacco use disorder is diagnosed by your health care provider. A diagnosis may be made by:  Your health care provider asking questions about your tobacco use and any problems it may be causing.  A physical exam.  Lab tests.  You may be referred to a mental health professional or addiction specialist. The severity of tobacco use disorder depends on the number of signs and symptoms you have:   Mild--Two or three symptoms.  Moderate--Four or five symptoms.   Severe--Six or more symptoms.  TREATMENT  Many people with tobacco use disorder are unable to quit on their own and need help. Treatment options include the following:  Nicotine replacement therapy (NRT). NRT provides nicotine without the other harmful chemicals in tobacco. NRT gradually lowers the dosage of nicotine in the body and reduces withdrawal symptoms. NRT is available in over-the-counter forms (gum, lozenges, and skin patches) as well as prescription forms (mouth inhaler and nasal spray).  Medicines.This may include:  Antidepressant  medicine that may reduce nicotine cravings.  A medicine that acts on nicotine receptors in the brain to reduce cravings and withdrawal symptoms. It may also block the effects of tobacco in people with TUD who relapse.  Counseling or talk therapy. A form of talk therapy called behavioral therapy is commonly used to treat people with TUD. Behavioral therapy looks at triggers for tobacco use, how to avoid them, and how to cope with cravings. It is most effective in person or by phone but is also available in self-help forms (books and Internet websites).  Support groups. These provide emotional support, advice, and guidance for quitting tobacco. The most effective treatment for TUD is usually a combination of medicine, talk therapy, and support  groups. HOME CARE INSTRUCTIONS  Keep all follow-up visits as directed by your health care provider. This is important.  Take medicines only as directed by your health care provider.  Check with your health care provider before starting new prescription or over-the-counter medicines. SEEK MEDICAL CARE IF:  You are not able to take your medicines as prescribed.  Treatment is not helping your TUD and your symptoms get worse. SEEK IMMEDIATE MEDICAL CARE IF:  You have serious thoughts about hurting yourself or others.  You have trouble breathing, chest pain, sudden weakness, or sudden numbness in part of your body.   This information is not intended to replace advice given to you by your health care provider. Make sure you discuss any questions you have with your health care provider.   Document Released: 08/03/2004 Document Revised: 12/19/2014 Document Reviewed: 01/24/2014 Elsevier Interactive Patient Education [employer]

## 2015-10-28 ENCOUNTER — Telehealth: Payer: Self-pay | Admitting: General Practice

## 2015-10-28 NOTE — Telephone Encounter (Signed)
I spoke with patient, he was advised to call surgeon who prescribed hydrocodone liquid to see if it could be changed to oxycodone. Thanks!

## 2015-12-22 ENCOUNTER — Ambulatory Visit: Payer: Self-pay | Admitting: Family Medicine
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
## 2016-05-30 ENCOUNTER — Encounter (HOSPITAL_COMMUNITY): Payer: Self-pay

## 2020-03-21 ENCOUNTER — Ambulatory Visit: Payer: Self-pay

## 2020-06-18 NOTE — Telephone Encounter (Signed)
In error

## 2020-12-16 ENCOUNTER — Encounter (HOSPITAL_COMMUNITY): Payer: Self-pay

## 2020-12-16 ENCOUNTER — Ambulatory Visit (HOSPITAL_COMMUNITY)
Admission: EM | Admit: 2020-12-16 | Discharge: 2020-12-16 | Disposition: A | Discharge status: Home / Self Care | Payer: HRSA Program | Attending: Family Medicine | Admitting: Family Medicine

## 2020-12-16 DIAGNOSIS — I10 Essential (primary) hypertension: Secondary | ICD-10-CM | POA: Insufficient documentation

## 2020-12-16 DIAGNOSIS — F17210 Nicotine dependence, cigarettes, uncomplicated: Secondary | ICD-10-CM | POA: Insufficient documentation

## 2020-12-16 DIAGNOSIS — R0602 Shortness of breath: Secondary | ICD-10-CM | POA: Insufficient documentation

## 2020-12-16 DIAGNOSIS — J069 Acute upper respiratory infection, unspecified: Secondary | ICD-10-CM | POA: Diagnosis present

## 2020-12-16 DIAGNOSIS — Z20822 Contact with and (suspected) exposure to covid-19: Secondary | ICD-10-CM | POA: Insufficient documentation

## 2020-12-16 LAB — SARS CORONAVIRUS 2 (TAT 6-24 HRS): SARS Coronavirus 2: NEGATIVE

## 2020-12-16 MED ORDER — PROMETHAZINE-DM 6.25-15 MG/5ML PO SYRP: 5.0000 mL | ORAL_SOLUTION | Freq: Four times a day (QID) | ORAL | 0 refills | Status: DC | PRN

## 2020-12-16 MED ORDER — ALBUTEROL SULFATE HFA 108 (90 BASE) MCG/ACT IN AERS: 1.0000 | INHALATION_SPRAY | Freq: Four times a day (QID) | RESPIRATORY_TRACT | 0 refills | Status: DC | PRN

## 2020-12-16 NOTE — ED Triage Notes (Signed)
Pt c/o productive cough with yellow sputum, subjective fever congestion, runny nose, HA, chest congestion, SOB for approx 5 days. Reports diarrhea onset Monday, emesis this morning 2/2 forceful coughing.  Denies sore throat, ear pain.  nyquil taken last night. Never filled RX for HTN.
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
## 2021-02-24 ENCOUNTER — Telehealth: Payer: Self-pay

## 2021-02-24 NOTE — Telephone Encounter (Signed)
Reached out to patient 2x's by phone to share locations of the [facility]. ULVM

## 2021-12-01 ENCOUNTER — Other Ambulatory Visit: Payer: Self-pay

## 2021-12-01 ENCOUNTER — Ambulatory Visit (HOSPITAL_COMMUNITY)
Admission: EM | Admit: 2021-12-01 | Discharge: 2021-12-01 | Disposition: A | Discharge status: Home / Self Care | Payer: 59 | Attending: Family Medicine | Admitting: Family Medicine

## 2021-12-01 ENCOUNTER — Encounter (HOSPITAL_COMMUNITY): Payer: Self-pay | Admitting: Emergency Medicine

## 2021-12-01 DIAGNOSIS — M542 Cervicalgia: Secondary | ICD-10-CM

## 2021-12-01 MED ORDER — PREDNISONE 20 MG PO TABS: 40.0000 mg | ORAL_TABLET | Freq: Every day | ORAL | 0 refills | Status: DC

## 2021-12-01 MED ORDER — IBUPROFEN 800 MG PO TABS: 800.0000 mg | ORAL_TABLET | Freq: Three times a day (TID) | ORAL | 0 refills | Status: DC

## 2021-12-01 NOTE — ED Triage Notes (Signed)
2 days ago started having pain behind right ear.  Since then, pain has worsened and now radiates down right side of neck and throat, painful to swallow on right side only

## 2021-12-02 NOTE — ED Provider Notes (Signed)
[facility]   [id number] 12/01/21 Arrival Time: 1732  ASSESSMENT & PLAN:  1. Neck pain on right side    No signs of infection including mastoiditis. Normal oropharyngeal exam. Ques MSK etiology. Discussed. OTC symptom care as needed.  Begin: Meds ordered this encounter  Medications   predniSONE (DELTASONE) 20 MG tablet    Sig: Take 2 tablets (40 mg total) by mouth daily.    Dispense:  10 tablet    Refill:  0   ibuprofen (ADVIL) 800 MG tablet    Sig: Take 1 tablet (800 mg total) by mouth 3 (three) times daily with meals.    Dispense:  21 tablet    Refill:  0     Follow-up Information     [facility], [facility].   Why: As needed. Contact information: [address] [place] [address] [phone]         [facility] [place].   Specialty: Urgent Care Why: If worsening or failing to improve as anticipated. Contact information: [address] [place] [address] [phone]                Reviewed expectations re: course of current medical issues. Questions answered. Outlined signs and symptoms indicating need for more acute intervention. Understanding verbalized. After Visit Summary given.   SUBJECTIVE: History from: patient. Daniel Shepard is a 40 y.o. male who reports: R sided neck pain; "around my ear"; grad onset; x 2 days; no significant worsening; mild ST. Denies: fever and difficulty breathing. Normal PO intake without n/v/d.  OBJECTIVE:  Vitals:   12/01/21 1917  BP: 130/86  Pulse: 76  Resp: 20  Temp: 99.2 F (37.3 C)  TempSrc: Oral  SpO2: 96%    General appearance: alert; no distress Eyes: PERRLA; EOMI; conjunctiva normal HENT: [place]; AT; without nasal congestion; throat appears normal Neck: supple without LAD; is tender over R lateral neck musculature Lungs: speaks full sentences without difficulty; unlabored Extremities: no edema Skin: warm and  dry Neurologic: normal gait Psychological: alert and cooperative; normal mood and affect   No Known Allergies  Past Medical History:  Diagnosis Date   Hypertension    Noted in 2007 but untreated   Hypertension    Substance abuse (HCC)    Social History   Socioeconomic History   Marital status: Single    Spouse name: Not on file   Number of children: Not on file   Years of education: Not on file   Highest education level: Not on file  Occupational History   Not on file  Tobacco Use   Smoking status: Every Day    Packs/day: 0.25    Types: Cigarettes   Smokeless tobacco: Never  Vaping Use   Vaping Use: Never used  Substance and Sexual Activity   Alcohol use: No    Comment: pt reports 6 beers daily   Drug use: No    Types: Marijuana    Comment: Denies prior cocaine, heroin or IV Use   Sexual activity: Not on file  Other Topics Concern   Not on file  Social History Narrative   ** Merged History Encounter **       Lives between grandmothers house and fiance   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, summer and fall   Social Determinants of Corporate investment banker Strain: Not on file  Food Insecurity: Not on file  Transportation Needs: Not on  file  Physical Activity: Not on file  Stress: Not on file  Social Connections: Not on file  Intimate Partner Violence: Not on file   Family History  Family history unknown: Yes  Problem Relation Age of Onset   Family history unknown: Yes   Healthy Mother    Healthy Father    Healthy Maternal Grandfather    Healthy Brother    Healthy Sister    Past Surgical History:  Procedure Laterality Date   CLOSED REDUCTION NASAL FRACTURE N/A 10/09/2015   Procedure: CLOSED REDUCTION NASAL FRACTURE;  Surgeon: Christia Reading, MD;  Location: [facility] OR;  Service: ENT;  Laterality: N/A;   ORIF MANDIBULAR FRACTURE N/A 10/09/2015   Procedure: OPEN REDUCTION INTERNAL FIXATION (ORIF) MANDIBULAR/OMaxillary FRACTURE;   Surgeon: Christia Reading, MD;  Location: [facility] OR;  Service: ENT;  Laterality: Vertis Kelch, MD 12/02/21 [phone]

## 2024-03-06 ENCOUNTER — Ambulatory Visit (HOSPITAL_COMMUNITY)
Admission: EM | Admit: 2024-03-06 | Discharge: 2024-03-06 | Disposition: A | Discharge status: Home / Self Care | Attending: Emergency Medicine | Admitting: Emergency Medicine

## 2024-03-06 ENCOUNTER — Other Ambulatory Visit: Payer: Self-pay

## 2024-03-06 ENCOUNTER — Encounter (HOSPITAL_COMMUNITY): Payer: Self-pay | Admitting: *Deleted

## 2024-03-06 DIAGNOSIS — I10 Essential (primary) hypertension: Secondary | ICD-10-CM

## 2024-03-06 MED ORDER — HYDROCHLOROTHIAZIDE 25 MG PO TABS: 25.0000 mg | ORAL_TABLET | Freq: Every day | ORAL | 1 refills | Status: AC
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
## 2024-03-06 NOTE — ED Notes (Signed)
 PCP appt made Pt verbalizes understanding.

## 2024-03-06 NOTE — ED Triage Notes (Signed)
 PT has no Hx of High BP . Pt never took meds for BP control. Pt was at [facility] night and used the machine to check BP 183/113 last night. Pt [facility] a different [facility] this morning to recheck BP in same type of machine at a different store. BP 191/122 this morning. PT's Father takes BP meds.

## 2024-03-06 NOTE — Discharge Instructions (Signed)
 Start taking hydrochlorothiazide once daily. I have given you an initial 30 day supply with one refill.  We have set you up with a primary care provider today. Keep this appointment and follow-up with them regarding further management of your blood pressure.  If you develop severe headache, blurred vision, dizziness, weakness, numbness, confusion, or chest pain please seek immediate medical treatment in the ER. Return here as needed.

## 2024-05-21 ENCOUNTER — Telehealth: Payer: Self-pay | Admitting: Family

## 2024-05-21 ENCOUNTER — Encounter: Admitting: Family

## 2024-05-21 NOTE — Progress Notes (Signed)
 Erroneous encounter-disregard

## 2024-05-21 NOTE — Telephone Encounter (Signed)
 Called pt; could not reach pt or leave vm to reschedule missed appt
